# Patient Record
Sex: Female | Born: 1995 | Hispanic: Yes | Marital: Single | Smoking: Former smoker
Health system: Southern US, Community
[De-identification: ages and names within clinical notes are randomized; demographics above are authoritative.]

## PROBLEM LIST (undated history)

## (undated) ENCOUNTER — Inpatient Hospital Stay (HOSPITAL_COMMUNITY): Payer: Self-pay

## (undated) DIAGNOSIS — Z8632 Personal history of gestational diabetes: Secondary | ICD-10-CM

## (undated) DIAGNOSIS — Z789 Other specified health status: Secondary | ICD-10-CM

---
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
## 2016-07-22 LAB — OB RESULTS CONSOLE ABO/RH: RH Type: POSITIVE

## 2016-07-22 LAB — OB RESULTS CONSOLE GC/CHLAMYDIA
Chlamydia: NEGATIVE
Gonorrhea: NEGATIVE

## 2016-07-22 LAB — OB RESULTS CONSOLE ANTIBODY SCREEN: ANTIBODY SCREEN: NEGATIVE

## 2016-07-22 LAB — OB RESULTS CONSOLE PLATELET COUNT: Platelets: 287 10*3/uL

## 2016-07-22 LAB — OB RESULTS CONSOLE HGB/HCT, BLOOD
HCT: 40 %
Hemoglobin: 13.2 g/dL

## 2016-07-22 LAB — OB RESULTS CONSOLE RPR: RPR: NONREACTIVE

## 2016-07-22 LAB — OB RESULTS CONSOLE VARICELLA ZOSTER ANTIBODY, IGG: VARICELLA IGG: IMMUNE

## 2016-08-01 LAB — CULTURE, OB URINE: Urine Culture, OB: NO GROWTH

## 2016-08-01 LAB — HEMOGLOBIN EVAL RFX ELECTROPHORESIS: HEMOGLOBIN EVALUATION: NORMAL

## 2016-08-01 LAB — SICKLE CELL SCREEN: Sickle Cell Screen: NORMAL

## 2016-08-01 LAB — OB RESULTS CONSOLE HIV ANTIBODY (ROUTINE TESTING): HIV: NONREACTIVE

## 2016-08-01 LAB — OB RESULTS CONSOLE HEPATITIS B SURFACE ANTIGEN: Hepatitis B Surface Ag: NEGATIVE

## 2016-08-01 LAB — CYTOLOGY - PAP: PAP SMEAR: NEGATIVE

## 2016-08-01 LAB — OB RESULTS CONSOLE RUBELLA ANTIBODY, IGM: Rubella: IMMUNE

## 2016-08-02 ENCOUNTER — Other Ambulatory Visit (HOSPITAL_COMMUNITY): Payer: Self-pay | Admitting: Nurse Practitioner

## 2016-08-02 DIAGNOSIS — Z3682 Encounter for antenatal screening for nuchal translucency: Secondary | ICD-10-CM

## 2016-08-09 ENCOUNTER — Encounter: Payer: Self-pay | Admitting: *Deleted

## 2016-08-12 ENCOUNTER — Ambulatory Visit: Payer: Medicaid Other | Admitting: *Deleted

## 2016-08-12 ENCOUNTER — Encounter: Payer: Medicaid Other | Attending: Family Medicine | Admitting: *Deleted

## 2016-08-12 DIAGNOSIS — Z713 Dietary counseling and surveillance: Secondary | ICD-10-CM | POA: Insufficient documentation

## 2016-08-12 DIAGNOSIS — O24410 Gestational diabetes mellitus in pregnancy, diet controlled: Secondary | ICD-10-CM | POA: Diagnosis not present

## 2016-08-12 DIAGNOSIS — Z3A00 Weeks of gestation of pregnancy not specified: Secondary | ICD-10-CM | POA: Diagnosis not present

## 2016-08-12 NOTE — Progress Notes (Signed)
  Patient was seen on 08/12/2016 for Gestational Diabetes self-management . She speaks [place], used live Veterinary surgeon. She states the nutrition was covered at her Health Dept. Visit. The following learning objectives were met by the patient :   States the definition of Gestational Diabetes  States why dietary management is important in controlling blood glucose  States when to check blood glucose levels  Demonstrates proper blood glucose monitoring techniques  States the effect of stress and exercise on blood glucose levels  States the importance of limiting caffeine and abstaining from alcohol and smoking  Plan:  Aim for 3 Carb Choices per meal (45 grams) +/- 1 either way  Aim for 1-2 Carbs per snack Begin reading food labels for Total Carbohydrate of foods Consider  increasing your activity level by walking or other activity daily as tolerated Begin checking BG before breakfast and 2 hours after first bite of breakfast, lunch and dinner as directed by MD  Bring Log Book to every medical appointment   Take medication if directed by MD  Blood glucose monitor Rx called into pharmacy> Accu Chek Aviva with Fast Clix drums Patient instructed to test pre breakfast and 2 hours each meal as directed by MD  Patient instructed to monitor glucose levels: FBS: 60 - <90 2 hour: <120  Patient received the following handouts: in Spanish  Nutrition Diabetes and Pregnancy  Carbohydrate Counting List  Patient will be seen for follow-up as needed.

## 2016-08-14 ENCOUNTER — Other Ambulatory Visit: Payer: Self-pay | Admitting: *Deleted

## 2016-08-14 DIAGNOSIS — O24419 Gestational diabetes mellitus in pregnancy, unspecified control: Secondary | ICD-10-CM

## 2016-08-14 MED ORDER — ACCU-CHEK GUIDE W/DEVICE KIT: 1.0000 | PACK | Freq: Once | 0 refills | Status: DC

## 2016-08-14 MED ORDER — ACCU-CHEK GUIDE W/DEVICE KIT: 1.0000 | PACK | Freq: Once | 0 refills | Status: AC

## 2016-08-14 MED ORDER — ACCU-CHEK FASTCLIX LANCETS MISC: 1.0000 | Freq: Four times a day (QID) | 12 refills | Status: DC

## 2016-08-14 MED ORDER — GLUCOSE BLOOD VI STRP: ORAL_STRIP | 12 refills | Status: DC

## 2016-08-15 ENCOUNTER — Encounter (HOSPITAL_COMMUNITY): Payer: Self-pay | Admitting: *Deleted

## 2016-08-16 ENCOUNTER — Ambulatory Visit (HOSPITAL_COMMUNITY)
Admission: RE | Admit: 2016-08-16 | Discharge: 2016-08-16 | Disposition: A | Discharge status: Home / Self Care | Payer: Medicaid Other | Source: Ambulatory Visit | Attending: Nurse Practitioner | Admitting: Nurse Practitioner

## 2016-08-16 ENCOUNTER — Encounter (HOSPITAL_COMMUNITY): Payer: Self-pay

## 2016-08-16 DIAGNOSIS — Z3682 Encounter for antenatal screening for nuchal translucency: Secondary | ICD-10-CM | POA: Diagnosis present

## 2016-08-16 HISTORY — DX: Other specified health status: Z78.9

## 2016-08-20 ENCOUNTER — Other Ambulatory Visit (HOSPITAL_COMMUNITY): Payer: Self-pay | Admitting: *Deleted

## 2016-08-20 DIAGNOSIS — O24319 Unspecified pre-existing diabetes mellitus in pregnancy, unspecified trimester: Secondary | ICD-10-CM

## 2016-08-21 ENCOUNTER — Encounter: Payer: Self-pay | Admitting: *Deleted

## 2016-08-21 ENCOUNTER — Ambulatory Visit (INDEPENDENT_AMBULATORY_CARE_PROVIDER_SITE_OTHER): Payer: Medicaid Other | Admitting: Family Medicine

## 2016-08-21 ENCOUNTER — Encounter: Payer: Self-pay | Admitting: Family Medicine

## 2016-08-21 DIAGNOSIS — O0990 Supervision of high risk pregnancy, unspecified, unspecified trimester: Secondary | ICD-10-CM | POA: Insufficient documentation

## 2016-08-21 DIAGNOSIS — Z8632 Personal history of gestational diabetes: Secondary | ICD-10-CM

## 2016-08-21 DIAGNOSIS — O0992 Supervision of high risk pregnancy, unspecified, second trimester: Secondary | ICD-10-CM

## 2016-08-21 DIAGNOSIS — O24419 Gestational diabetes mellitus in pregnancy, unspecified control: Secondary | ICD-10-CM | POA: Diagnosis not present

## 2016-08-21 HISTORY — DX: Personal history of gestational diabetes: Z86.32

## 2016-08-21 MED ORDER — ACCU-CHEK FASTCLIX LANCETS MISC: 1.0000 | Freq: Four times a day (QID) | 12 refills | Status: DC

## 2016-08-21 MED ORDER — ACCU-CHEK GUIDE W/DEVICE KIT: 1.0000 | PACK | Freq: Once | 0 refills | Status: AC

## 2016-08-21 MED ORDER — GLUCOSE BLOOD VI STRP: ORAL_STRIP | 12 refills | Status: DC

## 2016-08-21 NOTE — Patient Instructions (Signed)
Diagnstico de diabetes mellitus gestacional (Gestational Diabetes Mellitus, Diagnosis) La diabetes gestacional (diabetes mellitus gestacional) es una forma temporal de diabetes que algunas mujeres desarrollan durante el embarazo. Generalmente, ocurre alrededor de la semana 24 a la 28de gestacin y desaparece despus del parto. Los cambios hormonales durante el embarazo pueden interferir en la produccin y la actividad de la insulina, lo que puede derivar en uno de estos problemas o en ambos:  El pncreas no produce la cantidad suficiente de una hormona llamada insulina.  Las clulas del cuerpo no responden de manera adecuada a la insulina que el organismo produce (resistencia a la insulina). Normalmente, la insulina estimula el ingreso de la glucosa en las clulas del cuerpo. Las clulas usan la glucosa para obtener energa. La resistencia a la insulina o la falta de esta hormona hace que el exceso de glucosa se acumule en la sangre, en lugar de ir a las clulas. Como resultado, aumenta la glucemia (hiperglucemia). CULES SON LOS RIESGOS? Si la diabetes gestacional se trata, hay pocas probabilidades de que ocasione problemas. Si no se la controla con tratamiento, puede causar problemas durante el trabajo de parto y el parto, y algunos de esos problemas pueden ser dainos para el feto y la madre. La diabetes gestacional que no se controla tambin puede producir problemas respiratorios y bajo nivel de glucemia en el recin nacido. Las mujeres que tienen diabetes gestacional son ms propensas a desarrollar la afeccin si se embarazan de nuevo y a tener diabetes tipo2 en el futuro. QU INCREMENTA EL RIESGO? Es ms probable que esta afeccin se manifieste en las mujeres embarazadas que:  Son mayores de 26aos durante el embarazo.  Tienen antecedentes familiares de diabetes.  Tienen sobrepeso.  Tuvieron diabetes gestacional en el pasado.  Tienen sndrome de ovario poliqustico (SOP).  Tienen  un embarazo gemelar o un embarazo mltiple.  Son descendientes de indgenas norteamericanos, afroamericanos, hispanos o latinos, o asiticos o isleos del Pacfico. CULES SON LOS SIGNOS O LOS SNTOMAS? La mayora de las mujeres no perciben los sntomas de la diabetes gestacional porque son similares a los sntomas normales del embarazo. Los sntomas de la diabetes gestacional pueden incluir, entre otros:  Aumento de la sed (polidipsia).  Aumento del apetito (polifagia).  Aumento de la miccin (poliuria). CMO SE DIAGNOSTICA? Esta afeccin se puede diagnosticar en funcin del nivel de glucemia que puede medirse con uno o ms de los siguientes anlisis de sangre:  Medicin de la glucemia en ayunas. No se le permitir comer (tendr que hacer ayuno) durante al menos 8horas antes de que se tome una muestra de sangre.  Prueba al azar de la glucemia. Esta prueba mide la glucemia en cualquier momento del da, sin importar cundo comi.  Prueba de tolerancia a la glucosa oral (PTGO). Generalmente, se realiza durante la semana 24 a la 28de gestacin. ? Para esta prueba, le harn una medicin de la glucemia en ayunas. Luego, tomar una bebida que contiene glucosa. Se analizar el nivel de glucemia nuevamente 1hora despus de tomar la bebida con glucosa (PTGO a la hora). ? Si el resultado de la PTGO a la hora es igual o superior a 140mg/dl (7,8mmol/l), le repetirn la PTGO. Esta vez, se analizar el nivel de glucemia 3horas despus de tomar la bebida con glucosa (PTGO a las 3horas). Si tiene factores de riesgo, pueden hacerle pruebas de deteccin de la diabetes tipo2 no diagnosticada en la primera visita de atencin mdica durante el embarazo (visita prenatal). CMO SE TRATA ESTA AFECCIN?   El tratamiento puede estar a cargo de un especialista llamado endocrinlogo. Para tratar esta afeccin, debe seguir las indicaciones del mdico respecto de lo siguiente:  Comer una dieta ms saludable y  aumentar la actividad fsica. Estos cambios son lo ms importante para mantener la diabetes gestacional bajo control.  Controlarse la glucemia. Hgalo con la frecuencia que le hayan indicado.  Tomar los medicamentos para la diabetes o aplicarse la insulina todos los das. Estos frmacos se recetarn solamente si son necesarios. ? Si usa insulina, tal vez deba ajustar las dosis en funcin de la cantidad de actividad fsica que realiza y de los alimentos que consume. El mdico le indicar cmo hacerlo. El mdico establecer los objetivos del tratamiento para usted sobre la base de la etapa del embarazo en la que se encuentra y de cualquier otra afeccin que padezca. Generalmente, el objetivo del tratamiento es mantener los siguientes niveles de glucemia durante el embarazo:  En ayunas: igual o menor que 95mg/dl (5,3mmol/l).  Despus de las comidas (posprandial): ? Una hora despus de una comida: igual o menor que 140mg/dl (7,8mmol/l). ? Dos horas despus de una comida: igual o menor que 120mg/dl (6,7mmol/l).  Nivel de A1c (hemoglobinaA1c): del 6% al 6,5%. SIGA ESTAS INDICACIONES EN SU CASA: Preguntas para hacerle al mdico Considere la posibilidad de hacer las siguientes preguntas:  Debo reunirme con un instructor para el cuidado de la diabetes?  Dnde puedo encontrar un grupo de apoyo para personas diabticas?  Qu equipos necesitar para controlar la diabetes en casa?  Qu medicamentos para la diabetes necesito y cundo debo tomarlos?  Con qu frecuencia debo controlarme la glucemia?  A qu nmero puedo llamar si tengo preguntas?  Cundo es mi prxima cita? Instrucciones generales  Tome los medicamentos de venta libre y los recetados solamente como se lo haya indicado el mdico.  Controle el aumento de peso durante el embarazo. La cantidad de peso que se espera que aumente depende de su IMC (ndice de masa corporal) antes del embarazo.  Concurra a todas las  visitas de control como se lo haya indicado el mdico. Esto es importante.  Para obtener ms informacin sobre la diabetes, visite los siguientes sitios: ? Asociacin Americana de la Diabetes (American Diabetes Association, ADA): www.diabetes.org ? Asociacin Norteamericana de Instructores para el Cuidado de la Diabetes (American Association of Diabetes Educators, AADE): www.diabeteseducator.org/patient-resources COMUNQUESE CON UN MDICO SI:  El nivel de glucemia es igual o superior a 240mg/dl (13,3mmol/l).  El nivel de glucemia es igual o superior a 200mg/dl (11,1mmol/l), y tiene cetonas en la orina.  Ha estado enferma o ha tenido fiebre durante 2o ms das y no mejora.  Tiene alguno de los siguientes problemas durante ms de 6horas: ? No puede comer ni beber. ? Tiene nuseas y vmitos. ? Tiene diarrea.  SOLICITE AYUDA DE INMEDIATO SI:  Su nivel de glucosa en la sangre est por debajo de 54mg/dl (3mmol/l).  Est confundida o tiene dificultad para pensar con claridad.  Tiene dificultad para respirar.  Tiene un nivel moderado o alto de cetonas en la orina.  El beb se mueve menos de lo habitual.  Tiene secreciones inusuales o sangrado de la vagina.  Comienza a tener contracciones antes de tiempo (prematuramente). Las contracciones se pueden sentir como un endurecimiento de la parte inferior del abdomen.  Esta informacin no tiene como fin reemplazar el consejo del mdico. Asegrese de hacerle al mdico cualquier pregunta que tenga. Document Released: 01/02/2005 Document Revised: 07/17/2015 Document Reviewed: 04/28/2015 Elsevier Interactive   Education  2017 Elsevier Inc.  

## 2016-08-21 NOTE — Progress Notes (Signed)
  Subjective:  Aimee Lopez is a G2P0010 71w2dbeing seen today for her first obstetrical visit.  [facility] and had abnormal GTT. Her obstetrical history is significant for teen pregnancy and early gestational diabetes. Patient does intend to breast feed. Pregnancy history fully reviewed.  Patient reports no complaints.  BP (!) 112/54   Pulse 72   Wt 173 lb (78.5 kg)   LMP 05/20/2016   BMI 30.65 kg/m   HISTORY: OB History  Gravida Para Term Preterm AB Living  2 0 0 0 1 0  SAB TAB Ectopic Multiple Live Births  1 0 0 0 0    # Outcome Date GA Lbr Len/2nd Weight Sex Delivery Anes PTL Lv  2 Current           1 SAB 2016              Past Medical History:  Diagnosis Date  . Medical history non-contributory     Past Surgical History:  Procedure Laterality Date  . NO PAST SURGERIES      Family History  Problem Relation Age of Onset  . Diabetes Mother   . Diabetes Father   . Diabetes Maternal Aunt      Exam    Uterus:     Pelvic Exam:   System:     Skin: normal coloration and turgor, no rashes    Neurologic: gait normal; reflexes normal and symmetric   Extremities: normal strength, tone, and muscle mass, no erythema, induration, or nodules, no evidence of joint instability   HEENT PERRLA and extra ocular movement intact   Mouth/Teeth mucous membranes moist, pharynx normal without lesions   Neck supple and no masses   Cardiovascular: regular rate and rhythm, no murmurs or gallops   Respiratory:  appears well, vitals normal, no respiratory distress, acyanotic, normal RR, ear and throat exam is normal, neck free of mass or lymphadenopathy, chest clear, no wheezing, crepitations, rhonchi, normal symmetric air entry   Abdomen: soft, non-tender; bowel sounds normal; no masses,  no organomegaly      Assessment:    Pregnancy: G2P0010 Patient Active Problem List   Diagnosis Date Noted  . Gestational diabetes mellitus 08/21/2016  .  Supervision of high risk pregnancy, antepartum 08/21/2016      Plan:   1. Supervision of high risk pregnancy, antepartum FHT normal - Comprehensive metabolic panel - Hemoglobin A1c - Protein / creatinine ratio, urine  2. Gestational diabetes mellitus (GDM) affecting pregnancy, antepartum CBG supplies ordered. Will check HgA1c, CMP, UP:C. Discussed GDM and impact on health of baby, pregnancy, and delivery. Risks of shoulder dystocia, cesarean delivery, Brachial plexus injury, stillbirth reviewed.  - ACCU-CHEK FASTCLIX LANCETS MISC; 1 Device by Percutaneous route 4 (four) times daily.  Dispense: 100 each; Refill: 12 - glucose blood (ACCU-CHEK GUIDE) test strip; Use as instructed  Dispense: 100 each; Refill: 12 - Blood Glucose Monitoring Suppl (ACCU-CHEK GUIDE) w/Device KIT; 1 Device by Does not apply route once.  Dispense: 1 kit; Refill: 0 - Comprehensive metabolic panel - Hemoglobin A1c - Protein / creatinine ratio, urine     Problem list reviewed and updated. 100% of 30 min visit spent on counseling and coordination of care.     JTruett Mainland4/13/2018

## 2016-08-22 LAB — COMPREHENSIVE METABOLIC PANEL
A/G RATIO: 1.3 (ref 1.2–2.2)
ALT: 36 IU/L — ABNORMAL HIGH (ref 0–32)
AST: 24 IU/L (ref 0–40)
Albumin: 3.7 g/dL (ref 3.5–5.5)
Alkaline Phosphatase: 83 IU/L (ref 39–117)
BUN/Creatinine Ratio: 14 (ref 9–23)
BUN: 7 mg/dL (ref 6–20)
Bilirubin Total: <0.2 mg/dL (ref 0.0–1.2)
CO2: 20 mmol/L (ref 18–29)
Calcium: 8.6 mg/dL — ABNORMAL LOW (ref 8.7–10.2)
Chloride: 105 mmol/L (ref 96–106)
Creatinine, Ser: 0.49 mg/dL — ABNORMAL LOW (ref 0.57–1.00)
GFR calc Af Amer: 163 mL/min/{1.73_m2} (ref >59)
GFR calc non Af Amer: 142 mL/min/{1.73_m2} (ref >59)
GLUCOSE: 92 mg/dL (ref 65–99)
Globulin, Total: 2.8 g/dL (ref 1.5–4.5)
Potassium: 3.9 mmol/L (ref 3.5–5.2)
Sodium: 138 mmol/L (ref 134–144)
Total Protein: 6.5 g/dL (ref 6.0–8.5)

## 2016-08-22 LAB — HEMOGLOBIN A1C
Est. average glucose Bld gHb Est-mCnc: 105 mg/dL
Hgb A1c MFr Bld: 5.3 % (ref 4.8–5.6)

## 2016-09-09 ENCOUNTER — Ambulatory Visit (INDEPENDENT_AMBULATORY_CARE_PROVIDER_SITE_OTHER): Payer: Medicaid Other | Admitting: Obstetrics & Gynecology

## 2016-09-09 ENCOUNTER — Other Ambulatory Visit (HOSPITAL_COMMUNITY)
Admission: RE | Admit: 2016-09-09 | Discharge: 2016-09-09 | Disposition: A | Discharge status: Home / Self Care | Payer: Medicaid Other | Source: Ambulatory Visit | Attending: Obstetrics & Gynecology | Admitting: Obstetrics & Gynecology

## 2016-09-09 VITALS — BP 116/58 | HR 68 | Wt 171.2 lb

## 2016-09-09 DIAGNOSIS — O0992 Supervision of high risk pregnancy, unspecified, second trimester: Secondary | ICD-10-CM

## 2016-09-09 DIAGNOSIS — O0990 Supervision of high risk pregnancy, unspecified, unspecified trimester: Secondary | ICD-10-CM

## 2016-09-09 DIAGNOSIS — O24410 Gestational diabetes mellitus in pregnancy, diet controlled: Secondary | ICD-10-CM | POA: Insufficient documentation

## 2016-09-09 NOTE — Progress Notes (Signed)
   PRENATAL VISIT NOTE  Subjective:  Aimee Lopez is a 21 y.o. G2P0010 at 6260w0d being seen today for ongoing prenatal care.  She is currently monitored for the following issues for this high-risk pregnancy and has Gestational diabetes mellitus and Supervision of high risk pregnancy, antepartum on her problem list.  Patient reports rare pulling on right side.  No pressure nor discharge. Denies leaking of fluid.   The following portions of the patient's history were reviewed and updated as appropriate: allergies, current medications, past family history, past medical history, past social history, past surgical history and problem list. Problem list updated.  Objective:  There were no vitals filed for this visit.  Fetal Status:         140  General:  Alert, oriented and cooperative. Patient is in no acute distress.  Skin: Skin is warm and dry. No rash noted.   Cardiovascular: Normal heart rate noted  Respiratory: Normal respiratory effort, no problems with respiration noted  Abdomen: Soft, gravid, appropriate for gestational age.       Pelvic:  Cervical exam deferred        Extremities: Normal range of motion.     Mental Status: Normal mood and affect. Normal behavior. Normal judgment and thought content.   Assessment and Plan:  Pregnancy: G2P0010 at 9760w0d  1. Diet controlled gestational diabetes mellitus (GDM) in second trimester Fastings nml but one (high nml); encouraged to eat bedtime snack and evening walk.   Pp values are normal Nml Hgb A1c during organogenesis--no echo needed  2. Supervision of high risk pregnancy, antepartum -rpt protein creat ratio ALT mildly elevated in May 2018; Rpt in June or early July  Preterm labor symptoms and general obstetric precautions including but not limited to vaginal bleeding, contractions, leaking of fluid and fetal movement were reviewed in detail with the patient. Please refer to After Visit Summary for other counseling recommendations.     RTC 3 weeks  Elsie LincolnKelly Dereona Kolodny, MD

## 2016-09-10 LAB — CERVICOVAGINAL ANCILLARY ONLY
Bacterial vaginitis: NEGATIVE
CANDIDA VAGINITIS: NEGATIVE
Chlamydia: NEGATIVE
Neisseria Gonorrhea: NEGATIVE
Trichomonas: NEGATIVE

## 2016-09-27 ENCOUNTER — Encounter (HOSPITAL_COMMUNITY): Payer: Self-pay

## 2016-09-27 ENCOUNTER — Ambulatory Visit (HOSPITAL_COMMUNITY)
Admission: RE | Admit: 2016-09-27 | Discharge: 2016-09-27 | Disposition: A | Discharge status: Home / Self Care | Payer: Medicaid Other | Source: Ambulatory Visit | Attending: Nurse Practitioner | Admitting: Nurse Practitioner

## 2016-09-27 ENCOUNTER — Other Ambulatory Visit (HOSPITAL_COMMUNITY): Payer: Self-pay | Admitting: Maternal and Fetal Medicine

## 2016-09-27 DIAGNOSIS — O24319 Unspecified pre-existing diabetes mellitus in pregnancy, unspecified trimester: Secondary | ICD-10-CM

## 2016-09-27 DIAGNOSIS — O0990 Supervision of high risk pregnancy, unspecified, unspecified trimester: Secondary | ICD-10-CM

## 2016-09-27 DIAGNOSIS — O99212 Obesity complicating pregnancy, second trimester: Secondary | ICD-10-CM

## 2016-09-27 DIAGNOSIS — Z3689 Encounter for other specified antenatal screening: Secondary | ICD-10-CM

## 2016-09-27 DIAGNOSIS — O24410 Gestational diabetes mellitus in pregnancy, diet controlled: Secondary | ICD-10-CM

## 2016-09-27 DIAGNOSIS — Z3A18 18 weeks gestation of pregnancy: Secondary | ICD-10-CM | POA: Insufficient documentation

## 2016-09-30 ENCOUNTER — Other Ambulatory Visit (HOSPITAL_COMMUNITY): Payer: Self-pay | Admitting: *Deleted

## 2016-09-30 DIAGNOSIS — Z0489 Encounter for examination and observation for other specified reasons: Secondary | ICD-10-CM

## 2016-09-30 DIAGNOSIS — IMO0002 Reserved for concepts with insufficient information to code with codable children: Secondary | ICD-10-CM

## 2016-10-02 ENCOUNTER — Ambulatory Visit (INDEPENDENT_AMBULATORY_CARE_PROVIDER_SITE_OTHER): Payer: Medicaid Other | Admitting: Obstetrics & Gynecology

## 2016-10-02 VITALS — BP 110/55 | HR 82 | Wt 174.9 lb

## 2016-10-02 DIAGNOSIS — O0992 Supervision of high risk pregnancy, unspecified, second trimester: Secondary | ICD-10-CM | POA: Diagnosis not present

## 2016-10-02 DIAGNOSIS — O24410 Gestational diabetes mellitus in pregnancy, diet controlled: Secondary | ICD-10-CM | POA: Diagnosis not present

## 2016-10-02 DIAGNOSIS — O0990 Supervision of high risk pregnancy, unspecified, unspecified trimester: Secondary | ICD-10-CM

## 2016-10-02 LAB — POCT URINALYSIS DIP (DEVICE)
Bilirubin Urine: NEGATIVE
Glucose, UA: NEGATIVE mg/dL
Hgb urine dipstick: NEGATIVE
KETONES UR: NEGATIVE mg/dL
Nitrite: NEGATIVE
Protein, ur: NEGATIVE mg/dL
Specific Gravity, Urine: 1.015 (ref 1.005–1.030)
Urobilinogen, UA: 1 mg/dL (ref 0.0–1.0)
pH: 7.5 (ref 5.0–8.0)

## 2016-10-02 MED ORDER — PREPLUS 27-1 MG PO TABS: 1.0000 | ORAL_TABLET | Freq: Every day | ORAL | 13 refills | Status: DC

## 2016-10-02 NOTE — Patient Instructions (Signed)
Regrese a la clinica cuando tenga su cita. Si tiene problemas o preguntas, llama a la clinica o vaya a la sala de emergencia al [facility].  Segundo trimestre de Psychiatristembarazo (Second Trimester of Pregnancy) El segundo trimestre va desde la semana13 hasta la 28, desde el cuarto hasta el sexto mes, y suele ser el momento en el que mejor se siente. Su organismo se ha adaptado a Charity fundraiserestar embarazada y comienza a Diplomatic Services operational officersentirse fsicamente mejor. En general, las nuseas matutinas han disminuido o han desaparecido completamente, puede tener ms energa y un aumento de apetito. El segundo trimestre es tambin la poca en la que el feto se desarrolla rpidamente. Hacia el final del sexto mes, el feto mide aproximadamente 9pulgadas (23cm) y pesa alrededor de 1 libras (700g). Es probable que sienta que el beb se Teacher, English as a foreign languagemueve (da pataditas) entre las 18 y 20semanas del Psychiatristembarazo. CAMBIOS EN EL ORGANISMO Su organismo atraviesa por muchos cambios durante el Burnsembarazo, y estos varan de Neomia Dearuna mujer a Educational psychologistotra.  Seguir American Standard Companiesaumentando de peso. Notar que la parte baja del abdomen sobresale.  Podrn aparecer las primeras Albertson'sestras en las caderas, el abdomen y las Speedwaymamas.  Es posible que tenga dolores de cabeza que pueden aliviarse con los medicamentos que el mdico le permita tomar.  Tal vez tenga necesidad de orinar con ms frecuencia porque el feto est ejerciendo presin Ambulance personsobre la vejiga.  Debido al Vanetta Muldersembarazo podr sentir Anthoney Haradaacidez estomacal con frecuencia.  Puede estar estreida, ya que ciertas hormonas enlentecen los movimientos de los msculos que New York Life Insuranceempujan los desechos a travs de los intestinos.  Pueden aparecer hemorroides o abultarse e hincharse las venas (venas varicosas).  Puede tener dolor de espalda que se debe al Citigroupaumento de peso y a que las hormonas del Management consultantembarazo relajan las articulaciones entre los huesos de la pelvis, y Public librariancomo consecuencia de la modificacin del peso y los msculos que mantienen el equilibrio.  Las ConAgra Foodsmamas  seguirn creciendo y Development worker, communityle dolern.  Las Veterinary surgeonencas pueden sangrar y estar sensibles al cepillado y al hilo dental.  Pueden aparecer zonas oscuras o manchas (cloasma, mscara del Psychiatristembarazo) en el rostro que probablemente se atenuar despus del nacimiento del beb.  Es posible que se forme una lnea oscura desde el ombligo hasta la zona del pubis (linea nigra) que probablemente se atenuar despus del nacimiento del beb.  Tal vez haya cambios en el cabello que pueden incluir su engrosamiento, crecimiento rpido y cambios en la textura. Adems, a algunas mujeres se les cae el cabello durante o despus del embarazo, o tienen el cabello seco o fino. Lo ms probable es que el cabello se le normalice despus del nacimiento del beb. QU DEBE ESPERAR EN LAS CONSULTAS PRENATALES Durante una visita prenatal de rutina:  La pesarn para asegurarse de que usted y el feto estn creciendo normalmente.  Le tomarn la presin arterial.  Le medirn el abdomen para controlar el desarrollo del beb.  Se escucharn los latidos cardacos fetales.  Se evaluarn los resultados de los estudios solicitados en visitas anteriores. El mdico puede preguntarle lo siguiente:  Cmo se siente.  Si siente los movimientos del beb.  Si ha tenido sntomas anormales, como prdida de lquido, Kingston Springssangrado, dolores de cabeza intensos o clicos abdominales.  Si est consumiendo algn producto que contenga tabaco, como cigarrillos, tabaco de Theatre managermascar y Administrator, Civil Servicecigarrillos electrnicos.  Si tiene Colgate-Palmolivealguna pregunta. Otros estudios que podrn realizarse durante el segundo trimestre incluyen lo siguiente:  Anlisis de sangre para detectar lo siguiente: ? Concentraciones de  hierro bajas (anemia). ? Diabetes gestacional (entre la semana 24 y la 28). ? Anticuerpos Rh.  Anlisis de orina para detectar infecciones, diabetes o protenas en la orina.  Una ecografa para confirmar que el beb crece y se desarrolla correctamente.  Una  amniocentesis para diagnosticar posibles problemas genticos.  Estudios del feto para descartar espina bfida y sndrome de Down.  Prueba del VIH (virus de inmunodeficiencia humana). Los exmenes prenatales de rutina incluyen la prueba de deteccin del VIH, a menos que decida no Futures traderrealizrsela. INSTRUCCIONES PARA EL CUIDADO EN EL HOGAR  Evite fumar, consumir hierbas, beber alcohol y tomar frmacos que no le hayan recetado. Estas sustancias qumicas afectan la formacin y el desarrollo del beb.  No consuma ningn producto que contenga tabaco, lo que incluye cigarrillos, tabaco de Theatre managermascar y Administrator, Civil Servicecigarrillos electrnicos. Si necesita ayuda para dejar de fumar, consulte al American Expressmdico. Puede recibir asesoramiento y otro tipo de recursos para dejar de fumar.  Siga las indicaciones del mdico en relacin con el uso de medicamentos. Durante el embarazo, hay medicamentos que son seguros de tomar y otros que no.  Haga ejercicio solamente como se lo haya indicado el mdico. Sentir clicos uterinos es un buen signo para Restaurant manager, fast fooddetener la actividad fsica.  Contine comiendo alimentos sanos con regularidad.  Use un sostn que le brinde buen soporte si le Altria Groupduelen las mamas.  No se d baos de inmersin en agua caliente, baos turcos ni saunas.  Use el cinturn de seguridad en todo momento mientras conduce.  No coma carne cruda ni queso sin cocinar; evite el contacto con las bandejas sanitarias de los gatos y la tierra que estos animales usan. Estos elementos contienen grmenes que pueden causar defectos congnitos en el beb.  Tome las vitaminas prenatales.  Tome entre 1500 y 2000mg  de calcio diariamente comenzando en la semana20 del embarazo [place] Lakehasta el parto.  Si est estreida, pruebe un laxante suave (si el mdico lo autoriza). Consuma ms alimentos ricos en fibra, como vegetales y frutas frescos y Radiation protection practitionercereales integrales. Beba gran cantidad de lquido para mantener la orina de tono claro o color amarillo plido.  Dese  baos de asiento con agua tibia para Engineer, materialsaliviar el dolor o las molestias causadas por las hemorroides. Use una crema para las hemorroides si el mdico la autoriza.  Si tiene venas varicosas, use medias de descanso. Eleve los pies durante 15minutos, 3 o 4veces por da. Limite el consumo de sal en su dieta.  No levante objetos pesados, use zapatos de tacones bajos y [address] Boulevardmantenga una buena postura.  Descanse con las piernas elevadas si tiene calambres o dolor de cintura.  Visite a su dentista si an no lo ha Occupational hygienisthecho durante el embarazo. Use un cepillo de dientes blando para higienizarse los dientes y psese el hilo dental con suavidad.  Puede seguir Calpine Corporationmanteniendo relaciones sexuales, a menos que el mdico le indique lo contrario.  Concurra a todas las visitas prenatales segn las indicaciones de su mdico.  SOLICITE ATENCIN MDICA SI:  Santa Generaiene mareos.  Siente clicos leves, presin en la pelvis o dolor persistente en el abdomen.  Tiene nuseas, vmitos o diarrea persistentes.  Brett Fairybserva una secrecin vaginal con mal olor.  Siente dolor al ConocoPhillipsorinar.  SOLICITE ATENCIN MDICA DE INMEDIATO SI:  Tiene fiebre.  Tiene una prdida de lquido por la vagina.  Tiene sangrado o pequeas prdidas vaginales.  Siente dolor intenso o clicos en el abdomen.  Sube o baja de peso rpidamente.  Tiene dificultad para respirar y Electronics engineersiente dolor de  pecho.  Sbitamente se le hinchan mucho el rostro, las manos, los tobillos, los pies o las piernas.  No ha sentido los movimientos del beb durante Georgianne Fickuna hora.  Siente un dolor de cabeza intenso que no se alivia con medicamentos.  Su visin se modifica.  Esta informacin no tiene Theme park managercomo fin reemplazar el consejo del mdico. Asegrese de hacerle al mdico cualquier pregunta que tenga. Document Released: 01/02/2005 Document Revised: 04/15/2014 Document Reviewed: 05/26/2012 Elsevier Interactive Patient Education  2017 ArvinMeritorElsevier Inc.

## 2016-10-02 NOTE — Progress Notes (Signed)
   PRENATAL VISIT NOTE  Subjective:  Aimee Lopez is a 21 y.o. G2P0010 at 7362w2d being seen today for ongoing prenatal care. Patient is Spanish-speaking only, Spanish interpreter present for this encounter.  She is currently monitored for the following issues for this high-risk pregnancy and has Gestational diabetes mellitus and Supervision of high risk pregnancy, antepartum on her problem list.  Patient reports no complaints.  Contractions: Not present. Vag. Bleeding: None.  Movement: Present. Denies leaking of fluid.   The following portions of the patient's history were reviewed and updated as appropriate: allergies, current medications, past family history, past medical history, past social history, past surgical history and problem list. Problem list updated.  Objective:   Vitals:   10/02/16 1546  BP: (!) 110/55  Pulse: 82  Weight: 174 lb 14.4 oz (79.3 kg)    Fetal Status: Fetal Heart Rate (bpm): 145 Fundal Height: 19 cm Movement: Present     General:  Alert, oriented and cooperative. Patient is in no acute distress.  Skin: Skin is warm and dry. No rash noted.   Cardiovascular: Normal heart rate noted  Respiratory: Normal respiratory effort, no problems with respiration noted  Abdomen: Soft, gravid, appropriate for gestational age. Pain/Pressure: Absent     Pelvic:  Cervical exam deferred        Extremities: Normal range of motion.  Edema: Trace  Mental Status: Normal mood and affect. Normal behavior. Normal judgment and thought content.   Assessment and Plan:  Pregnancy: G2P0010 at 5562w2d  1. Diet controlled gestational diabetes mellitus (GDM) in second trimester Blood sugars are within range, continue diet and exercise  2. Supervision of high risk pregnancy, antepartum - Prenatal Vit-Fe Fumarate-FA (PREPLUS) 27-1 MG TABS; Take 1 tablet by mouth daily.  Dispense: 30 tablet; Refill: 13  Preterm labor symptoms and general obstetric precautions including but not limited to  vaginal bleeding, contractions, leaking of fluid and fetal movement were reviewed in detail with the patient. Please refer to After Visit Summary for other counseling recommendations.  Return in about 4 weeks (around 10/30/2016) for OB Visit (HOB).   Jaynie CollinsUgonna Deontaye Civello, MD

## 2016-10-02 NOTE — Progress Notes (Signed)
Stratus interpreter SardisMiguel [phone].

## 2016-10-25 ENCOUNTER — Ambulatory Visit (HOSPITAL_COMMUNITY)
Admission: RE | Admit: 2016-10-25 | Discharge: 2016-10-25 | Disposition: A | Discharge status: Home / Self Care | Payer: Medicaid Other | Source: Ambulatory Visit | Attending: Nurse Practitioner | Admitting: Nurse Practitioner

## 2016-10-25 ENCOUNTER — Other Ambulatory Visit (HOSPITAL_COMMUNITY): Payer: Self-pay | Admitting: Maternal and Fetal Medicine

## 2016-10-25 DIAGNOSIS — O24312 Unspecified pre-existing diabetes mellitus in pregnancy, second trimester: Secondary | ICD-10-CM | POA: Insufficient documentation

## 2016-10-25 DIAGNOSIS — Z3682 Encounter for antenatal screening for nuchal translucency: Secondary | ICD-10-CM | POA: Insufficient documentation

## 2016-10-25 DIAGNOSIS — Z362 Encounter for other antenatal screening follow-up: Secondary | ICD-10-CM | POA: Insufficient documentation

## 2016-10-25 DIAGNOSIS — Z0489 Encounter for examination and observation for other specified reasons: Secondary | ICD-10-CM

## 2016-10-25 DIAGNOSIS — Z3A22 22 weeks gestation of pregnancy: Secondary | ICD-10-CM | POA: Insufficient documentation

## 2016-10-25 DIAGNOSIS — O99212 Obesity complicating pregnancy, second trimester: Secondary | ICD-10-CM | POA: Diagnosis not present

## 2016-10-25 DIAGNOSIS — IMO0002 Reserved for concepts with insufficient information to code with codable children: Secondary | ICD-10-CM

## 2016-10-31 ENCOUNTER — Encounter: Payer: Medicaid Other | Admitting: Family Medicine

## 2016-11-28 ENCOUNTER — Ambulatory Visit (INDEPENDENT_AMBULATORY_CARE_PROVIDER_SITE_OTHER): Payer: Self-pay | Admitting: Obstetrics & Gynecology

## 2016-11-28 VITALS — BP 100/72 | HR 82 | Wt 185.0 lb

## 2016-11-28 DIAGNOSIS — O0990 Supervision of high risk pregnancy, unspecified, unspecified trimester: Secondary | ICD-10-CM

## 2016-11-28 DIAGNOSIS — O0992 Supervision of high risk pregnancy, unspecified, second trimester: Secondary | ICD-10-CM

## 2016-11-28 DIAGNOSIS — Z23 Encounter for immunization: Secondary | ICD-10-CM

## 2016-11-28 LAB — POCT URINALYSIS DIP (DEVICE)
Bilirubin Urine: NEGATIVE
Glucose, UA: NEGATIVE mg/dL
HGB URINE DIPSTICK: NEGATIVE
KETONES UR: NEGATIVE mg/dL
Nitrite: NEGATIVE
PH: 7 (ref 5.0–8.0)
PROTEIN: NEGATIVE mg/dL
SPECIFIC GRAVITY, URINE: 1.02 (ref 1.005–1.030)
Urobilinogen, UA: 0.2 mg/dL (ref 0.0–1.0)

## 2016-11-28 LAB — GLUCOSE, CAPILLARY: GLUCOSE-CAPILLARY: 93 mg/dL (ref 65–99)

## 2016-11-28 MED ORDER — PREPLUS 27-1 MG PO TABS: 1.0000 | ORAL_TABLET | Freq: Every day | ORAL | 13 refills | Status: DC

## 2016-11-28 NOTE — Addendum Note (Signed)
Addended by: Cheree Ditto, DEMETRICE A on: 11/28/2016 11:40 AM   Modules accepted: Orders

## 2016-11-28 NOTE — Progress Notes (Signed)
tdap given    PRENATAL VISIT NOTE  Subjective:  Aimee Lopez is a 21 y.o. G2P0010 at 74w3dbeing seen today for ongoing prenatal care.  She is currently monitored for the following issues for this high-risk pregnancy and has Gestational diabetes mellitus and Supervision of high risk pregnancy, antepartum on her problem list.  Patient reports no complaints.   .  .  Movement: Present. Denies leaking of fluid.   The following portions of the patient's history were reviewed and updated as appropriate: allergies, current medications, past family history, past medical history, past social history, past surgical history and problem list. Problem list updated.  Objective:   Vitals:   11/28/16 0833  BP: 100/72  Pulse: 82  Weight: 185 lb (83.9 kg)    Fetal Status: Fetal Heart Rate (bpm): 158   Movement: Present     General:  Alert, oriented and cooperative. Patient is in no acute distress.  Skin: Skin is warm and dry. No rash noted.   Cardiovascular: Normal heart rate noted  Respiratory: Normal respiratory effort, no problems with respiration noted  Abdomen: Soft, gravid, appropriate for gestational age.  Pain/Pressure: Present     Pelvic: Cervical exam deferred        Extremities: Normal range of motion.     Mental Status:  Normal mood and affect. Normal behavior. Normal judgment and thought content.   Assessment and Plan:  Pregnancy: G2P0010 at 244w3d1. Supervision of high risk pregnancy in second trimester - HIV antibody - RPR - CBC - Comp Met (CMET) - PNV printed so she can take to the health dept.  2.  GDM Did not bring log book.  Reports all values to be normal.  Pt encouraged to bring book and meter every visit! Fasting 93.  Preterm labor symptoms and general obstetric precautions including but not limited to vaginal bleeding, contractions, leaking of fluid and fetal movement were reviewed in detail with the patient. Please refer to After Visit Summary for other  counseling recommendations.  Return in about 2 years (around 11/29/2018).   KeSilas SacramentoMD

## 2016-11-30 LAB — URINE CULTURE, OB REFLEX

## 2016-11-30 LAB — CULTURE, OB URINE

## 2016-12-12 ENCOUNTER — Ambulatory Visit (INDEPENDENT_AMBULATORY_CARE_PROVIDER_SITE_OTHER): Payer: Self-pay | Admitting: Obstetrics and Gynecology

## 2016-12-12 VITALS — BP 110/62 | HR 86 | Wt 168.0 lb

## 2016-12-12 DIAGNOSIS — O0993 Supervision of high risk pregnancy, unspecified, third trimester: Secondary | ICD-10-CM

## 2016-12-12 LAB — POCT URINALYSIS DIP (DEVICE)
Bilirubin Urine: NEGATIVE
GLUCOSE, UA: 500 mg/dL — AB
Hgb urine dipstick: NEGATIVE
NITRITE: NEGATIVE
Protein, ur: NEGATIVE mg/dL
UROBILINOGEN UA: 1 mg/dL (ref 0.0–1.0)
pH: 6 (ref 5.0–8.0)

## 2016-12-12 LAB — GLUCOSE, CAPILLARY: Glucose-Capillary: 73 mg/dL (ref 65–99)

## 2016-12-12 NOTE — Patient Instructions (Addendum)
Informacin sobre Government social research officerel parto prematuro  (Preterm Labor Information)  Se llama parto prematuro cuando se inicia antes de las 37 semanas de Warrenembarazo. La duracin de un embarazo normal es de 39 a 41 semanas.  CAUSAS  Generalmente las causas del parto prematuro no se conocen. La causa ms frecuente conocida es una infeccin.  FACTORES DE RIESGO   Historia previa de parto prematuro.  Romper la bolsa de aguas antes de Kincheloetiempo.  La placenta cubre la abertura del cuello.  La placenta se despega del tero.  El cuello es demasiado dbil para contener al beb en el tero.  Hay mucho lquido en el saco amnitico.  Consumo de drogas o hbito de fumar durante el Arnoldembarazo.  No aumentar de peso lo suficiente durante el Big Lotsembarazo.  Mujeres menores de 18 aos o mayores de [address] [place] aos.  Tener bajos ingresos.  Pertenecer a Engineer, productionla raza afroamericana. SNTOMAS   Clicos similares a los menstruales, dolor en el vientre (abdominal) o dolor en la espalda.  Contracciones regulares, tan frecuentes como seis en Marshall & Ilsleyuna hora. Pueden ser suaves o dolorosas.  Contracciones que comienzan en la parte superior del vientre. Luego bajan hacia la zona inferior del vientre y Hilton Hotelshacia la espalda.  Presin en la zona inferior del vientre que Advertising account executiveparece empeorar.  Sangrado que proviene de la vagina.  Prdida de lquido por la vagina. TRATAMIENTO  El tratamiento depende de:   Su estado.  El New Waterfordestado del beb.  Cuntas semanas tiene de Hay Springsembarazo. El mdico podr indicarle:   Medicamentos para Print production plannerdetener las contracciones.  Que permanezca en la cama excepto para ir al bao (reposo en cama).  Que permanezca en el hospital. QU DEBE HACER SI PIENSA QUE EST EN TRABAJO DE PARTO PREMATURO?  Comunquese con su mdico de inmediato. Debe concurrir al hospital para ser controlada inmediatamente.  CMO PUEDE EVITAR EL TRABAJO DE PARTO PREMATURO EN FUTUROS EMBARAZOS?   Si fuma, abandone el hbito.  Mantenga un aumento de peso  saludable.  Notome drogas ni manipule sustancias qumicas que no necesita.  Informe a su mdico si piensa que tiene una infeccin.  Informe a su mdico si tuvo un trabajo de parto prematuro anteriormente. Esta informacin no tiene Theme park managercomo fin reemplazar el consejo del mdico. Asegrese de hacerle al mdico cualquier pregunta que tenga. Document Released: 04/27/2010 Document Revised: 11/25/2012 Elsevier Interactive Patient Education  2017 ArvinMeritorElsevier Inc.

## 2016-12-12 NOTE — Progress Notes (Signed)
Spanish interpreter "Raquel" [phone]

## 2016-12-12 NOTE — Progress Notes (Signed)
   PRENATAL VISIT NOTE  Subjective:  Aimee DunkerGloria Lopez is a 21 y.o. G2P0010 at 3256w3d being seen today for ongoing prenatal care.  She is currently monitored for the following issues for this high-risk pregnancy and has Gestational diabetes mellitus and Supervision of high risk pregnancy, antepartum on her problem list.  Patient reports no complaints.  Contractions: Not present. Vag. Bleeding: None.  Movement: Present. Denies leaking of fluid.   The following portions of the patient's history were reviewed and updated as appropriate: allergies, current medications, past family history, past medical history, past social history, past surgical history and problem list. Problem list updated.  Objective:   Vitals:   12/12/16 1259  BP: 110/62  Pulse: 86  Weight: 168 lb (76.2 kg)    Fetal Status: Fetal Heart Rate (bpm): 140   Movement: Present     General:  Alert, oriented and cooperative. Patient is in no acute distress.  Skin: Skin is warm and dry. No rash noted.   Cardiovascular: Normal heart rate noted  Respiratory: Normal respiratory effort, no problems with respiration noted  Abdomen: Soft, gravid, appropriate for gestational age. Pain/Pressure: Absent     Pelvic:  Cervical exam deferred        Extremities: Normal range of motion.  Edema: Trace  Mental Status: Normal mood and affect. Normal behavior. Normal judgment and thought content.   Assessment and Plan:  Pregnancy: G2P0010 at 6256w3d  1. Diet controlled gestational diabetes mellitus (GDM) in second trimester - Reported blood sugars are wnl, POC CBG 73 - POC UA significant for 500 of glucose, repeat UA at next visit and consider A1C - 28wk labs - MFM OB FOLLOW UP at 38wks   2. Supervision of high risk pregnancy, antepartum - Routine care  There are no diagnoses linked to this encounter. Preterm labor symptoms and general obstetric precautions including but not limited to vaginal bleeding, contractions, leaking of fluid and  fetal movement were reviewed in detail with the patient. Please refer to After Visit Summary for other counseling recommendations.  No Follow-up on file.  Dannette Barbararew Eziah Negro, Medical Student

## 2016-12-13 LAB — CBC
HEMATOCRIT: 36.7 % (ref 34.0–46.6)
Hemoglobin: 12.1 g/dL (ref 11.1–15.9)
MCH: 29.2 pg (ref 26.6–33.0)
MCHC: 33 g/dL (ref 31.5–35.7)
MCV: 89 fL (ref 79–97)
PLATELETS: 254 10*3/uL (ref 150–379)
RBC: 4.14 x10E6/uL (ref 3.77–5.28)
RDW: 14 % (ref 12.3–15.4)
WBC: 10.5 10*3/uL (ref 3.4–10.8)

## 2016-12-13 LAB — COMPREHENSIVE METABOLIC PANEL
A/G RATIO: 1.2 (ref 1.2–2.2)
ALT: 14 IU/L (ref 0–32)
AST: 16 IU/L (ref 0–40)
Albumin: 3.6 g/dL (ref 3.5–5.5)
Alkaline Phosphatase: 118 IU/L — ABNORMAL HIGH (ref 39–117)
BILIRUBIN TOTAL: 0.2 mg/dL (ref 0.0–1.2)
BUN/Creatinine Ratio: 26 — ABNORMAL HIGH (ref 9–23)
BUN: 9 mg/dL (ref 6–20)
CALCIUM: 8.8 mg/dL (ref 8.7–10.2)
CHLORIDE: 103 mmol/L (ref 96–106)
CO2: 20 mmol/L (ref 20–29)
Creatinine, Ser: 0.35 mg/dL — ABNORMAL LOW (ref 0.57–1.00)
GFR calc Af Amer: 182 mL/min/{1.73_m2} (ref >59)
GFR calc non Af Amer: 158 mL/min/{1.73_m2} (ref >59)
GLUCOSE: 64 mg/dL — AB (ref 65–99)
Globulin, Total: 3.1 g/dL (ref 1.5–4.5)
POTASSIUM: 3.7 mmol/L (ref 3.5–5.2)
Sodium: 138 mmol/L (ref 134–144)
Total Protein: 6.7 g/dL (ref 6.0–8.5)

## 2016-12-13 LAB — HIV ANTIBODY (ROUTINE TESTING W REFLEX): HIV Screen 4th Generation wRfx: NONREACTIVE

## 2016-12-13 LAB — RPR: RPR Ser Ql: NONREACTIVE

## 2016-12-15 ENCOUNTER — Inpatient Hospital Stay (HOSPITAL_COMMUNITY)
Admission: AD | Admit: 2016-12-15 | Discharge: 2016-12-16 | Disposition: A | Discharge status: Home / Self Care | Payer: Self-pay | Source: Ambulatory Visit | Attending: Obstetrics and Gynecology | Admitting: Obstetrics and Gynecology

## 2016-12-15 DIAGNOSIS — O26893 Other specified pregnancy related conditions, third trimester: Secondary | ICD-10-CM | POA: Insufficient documentation

## 2016-12-15 DIAGNOSIS — O99891 Other specified diseases and conditions complicating pregnancy: Secondary | ICD-10-CM

## 2016-12-15 DIAGNOSIS — Z3A30 30 weeks gestation of pregnancy: Secondary | ICD-10-CM | POA: Insufficient documentation

## 2016-12-15 DIAGNOSIS — O9989 Other specified diseases and conditions complicating pregnancy, childbirth and the puerperium: Secondary | ICD-10-CM

## 2016-12-15 DIAGNOSIS — R110 Nausea: Secondary | ICD-10-CM

## 2016-12-15 DIAGNOSIS — M545 Low back pain: Secondary | ICD-10-CM | POA: Insufficient documentation

## 2016-12-15 DIAGNOSIS — O0990 Supervision of high risk pregnancy, unspecified, unspecified trimester: Secondary | ICD-10-CM

## 2016-12-15 DIAGNOSIS — R1033 Periumbilical pain: Secondary | ICD-10-CM

## 2016-12-15 DIAGNOSIS — M549 Dorsalgia, unspecified: Secondary | ICD-10-CM

## 2016-12-15 DIAGNOSIS — J069 Acute upper respiratory infection, unspecified: Secondary | ICD-10-CM | POA: Insufficient documentation

## 2016-12-15 DIAGNOSIS — R1030 Lower abdominal pain, unspecified: Secondary | ICD-10-CM | POA: Insufficient documentation

## 2016-12-15 DIAGNOSIS — Z87891 Personal history of nicotine dependence: Secondary | ICD-10-CM | POA: Insufficient documentation

## 2016-12-15 DIAGNOSIS — R0981 Nasal congestion: Secondary | ICD-10-CM | POA: Insufficient documentation

## 2016-12-15 DIAGNOSIS — O99513 Diseases of the respiratory system complicating pregnancy, third trimester: Secondary | ICD-10-CM | POA: Insufficient documentation

## 2016-12-15 DIAGNOSIS — O24410 Gestational diabetes mellitus in pregnancy, diet controlled: Secondary | ICD-10-CM

## 2016-12-16 ENCOUNTER — Encounter (HOSPITAL_COMMUNITY): Payer: Self-pay

## 2016-12-16 DIAGNOSIS — O26893 Other specified pregnancy related conditions, third trimester: Secondary | ICD-10-CM

## 2016-12-16 DIAGNOSIS — R1033 Periumbilical pain: Secondary | ICD-10-CM

## 2016-12-16 LAB — URINALYSIS, ROUTINE W REFLEX MICROSCOPIC
BILIRUBIN URINE: NEGATIVE
Glucose, UA: NEGATIVE mg/dL
Hgb urine dipstick: NEGATIVE
KETONES UR: NEGATIVE mg/dL
Nitrite: NEGATIVE
PROTEIN: NEGATIVE mg/dL
RBC / HPF: NONE SEEN RBC/hpf (ref 0–5)
Specific Gravity, Urine: 1.02 (ref 1.005–1.030)
pH: 5 (ref 5.0–8.0)

## 2016-12-16 MED ORDER — SALINE SPRAY 0.65 % NA SOLN: 1.0000 | NASAL | 0 refills | Status: DC | PRN

## 2016-12-16 MED ORDER — PSEUDOEPHEDRINE HCL 30 MG PO TABS
60.0000 mg | ORAL_TABLET | Freq: Once | ORAL | Status: AC
  Administered 2016-12-16: 60 mg via ORAL
  Filled 2016-12-16: qty 2

## 2016-12-16 MED ORDER — PROMETHAZINE HCL 25 MG PO TABS: 25.0000 mg | ORAL_TABLET | Freq: Four times a day (QID) | ORAL | 2 refills | Status: DC | PRN

## 2016-12-16 MED ORDER — PROMETHAZINE HCL 25 MG PO TABS
25.0000 mg | ORAL_TABLET | Freq: Once | ORAL | Status: AC
  Administered 2016-12-16: 25 mg via ORAL
  Filled 2016-12-16: qty 1

## 2016-12-16 NOTE — MAU Provider Note (Signed)
Chief Complaint:  Abdominal Pain and Back Pain  Provider saw patient at 0035   HPI: Aimee Lopez is a 21 y.o. G2P0010 at 14w0dwho presents to maternity admissions reporting mid abdominal cramping, near umbilicus.  No tightening with pain.  Has new onset of nausea since 8pm.  No diarrhea.  No fever.   Also complains of nasal congestion for 2 days She reports good fetal movement, denies LOF, vaginal bleeding, vaginal itching/burning, urinary symptoms, h/a, dizziness, diarrhea, constipation or fever/chills.    Abdominal Pain  This is a new problem. The current episode started today. The onset quality is gradual. The problem occurs intermittently. The problem has been unchanged. The pain is located in the periumbilical region. The pain is mild. The quality of the pain is colicky and cramping. The abdominal pain radiates to the back. Associated symptoms include nausea. Pertinent negatives include no constipation, diarrhea, dysuria, fever, frequency, headaches, myalgias or vomiting. Nothing aggravates the pain. The pain is relieved by nothing. She has tried nothing for the symptoms.  Back Pain  Associated symptoms include abdominal pain. Pertinent negatives include no dysuria, fever or headaches.   RN Note: Pt c/o lower back pain and lower abdominal pain that started today at 8pm. Pt c/o nausea that started at 7pm. Pt states baby is moving normally. Pt denies bleeding.  Past Medical History: Past Medical History:  Diagnosis Date  . Medical history non-contributory     Past obstetric history: OB History  Gravida Para Term Preterm AB Living  2 0 0 0 1 0  SAB TAB Ectopic Multiple Live Births  1 0 0 0 0    # Outcome Date GA Lbr Len/2nd Weight Sex Delivery Anes PTL Lv  2 Current           1 SAB 2016              Past Surgical History: Past Surgical History:  Procedure Laterality Date  . NO PAST SURGERIES      Family History: Family History  Problem Relation Age of Onset  . Diabetes  Mother   . Diabetes Father   . Diabetes Maternal Aunt     Social History: Social History  Substance Use Topics  . Smoking status: Former Smoker    Quit date: 06/17/2016  . Smokeless tobacco: Never Used  . Alcohol use No    Allergies: No Known Allergies  Meds:  Prescriptions Prior to Admission  Medication Sig Dispense Refill Last Dose  . ACCU-CHEK FASTCLIX LANCETS MISC 1 Device by Percutaneous route 4 (four) times daily. 100 each 12 Taking  . glucose blood (ACCU-CHEK GUIDE) test strip Use as instructed 100 each 12 Taking  . Prenatal Vit-Fe Fumarate-FA (PREPLUS) 27-1 MG TABS Take 1 tablet by mouth daily. 30 tablet 13 Taking    I have reviewed patient's Past Medical Hx, Surgical Hx, Family Hx, Social Hx, medications and allergies.   ROS:  Review of Systems  Constitutional: Negative for fever.  HENT: Positive for congestion, rhinorrhea and sinus pressure. Negative for ear pain and sore throat.   Gastrointestinal: Positive for abdominal pain and nausea. Negative for constipation, diarrhea and vomiting.  Genitourinary: Negative for dysuria and frequency.  Musculoskeletal: Positive for back pain. Negative for myalgias.  Neurological: Negative for headaches.   Other systems negative  Physical Exam  Patient Vitals for the past 24 hrs:  BP Temp Temp src Pulse Resp SpO2 Height Weight  12/16/16 0003 124/61 98.4 F (36.9 C) Oral 96 18 99 % 5'  1.5" (1.562 m) 188 lb 4 oz (85.4 kg)   Constitutional: Well-developed, well-nourished female in no acute distress.  Cardiovascular: normal rate and rhythm Respiratory: normal effort, clear to auscultation bilaterally GI: Abd soft, non-tender, gravid appropriate for gestational age.   No rebound or guarding. MS: Extremities nontender, no edema, normal ROM Neurologic: Alert and oriented x 4.  GU: Neg CVAT.  PELVIC EXAM:  Dilation: Closed Effacement (%): 50 Exam by:: Wynelle BourgeoisMarie Spiro Ausborn, CNM  FHT:  Baseline 140 , moderate variability,  accelerations present, no decelerations Contractions:  Rare   Labs: Results for orders placed or performed during the hospital encounter of 12/15/16 (from the past 24 hour(s))  Urinalysis, Routine w reflex microscopic     Status: Abnormal   Collection Time: 12/15/16 11:58 PM  Result Value Ref Range   Color, Urine YELLOW YELLOW   APPearance CLOUDY (A) CLEAR   Specific Gravity, Urine 1.020 1.005 - 1.030   pH 5.0 5.0 - 8.0   Glucose, UA NEGATIVE NEGATIVE mg/dL   Hgb urine dipstick NEGATIVE NEGATIVE   Bilirubin Urine NEGATIVE NEGATIVE   Ketones, ur NEGATIVE NEGATIVE mg/dL   Protein, ur NEGATIVE NEGATIVE mg/dL   Nitrite NEGATIVE NEGATIVE   Leukocytes, UA MODERATE (A) NEGATIVE   RBC / HPF NONE SEEN 0 - 5 RBC/hpf   WBC, UA 6-30 0 - 5 WBC/hpf   Bacteria, UA RARE (A) NONE SEEN   Squamous Epithelial / LPF 6-30 (A) NONE SEEN   Mucus PRESENT    Ca Oxalate Crys, UA PRESENT    B/Positive/-- (04/16 0000)  Imaging:  No results found.  MAU Course/MDM: I have ordered labs and reviewed results. Urine showed leukocytes but is not a good Clean catch NST reviewed and found to be reassuring  Treatments in MAU included Sudafed for congestion and Phenergan for nausea She did get good relief of congestion and nausea.  States abdominal and back pain are completely gone. I suspect since the nausea is new, with periumbilical cramping, may have early gastroenteritis.    Assessment: 1. Diet controlled gestational diabetes mellitus (GDM) in second trimester   2. Supervision of high risk pregnancy, antepartum   3.     Abdominal cramping, resolved 4.     Back pain resolved 5.      URI/nasal congestion, improved  Plan: Discharge home Preterm Labor precautions and fetal kick counts Follow up in Office for prenatal visits and recheck of status Rx Nasal saline spray for congestion Rx Phenergan for nausea  Encouraged to return here or to other Urgent Care/ED if she develops worsening of symptoms,  increase in pain, fever, or other concerning symptoms.   Pt stable at time of discharge.  Wynelle BourgeoisMarie Breck [place] CNM, MSN Certified Nurse-Midwife 12/16/2016 1:00 AM

## 2016-12-16 NOTE — Discharge Instructions (Signed)
Nuseas en los adultos (Nausea, Adult) Tener nuseas significa sentir Programme researcher, broadcasting/film/video o ganas de vomitar. Las nuseas en s a menudo no revisten gravedad, pero pueden ser un signo temprano de problemas mdicos ms graves. Si las nuseas empeoran, pueden ocasionar vmitos. Si vomita o si no puede beber suficiente lquido, corre el riesgo de deshidratarse. La deshidratacin puede hacer que se sienta cansado y sediento, que tenga la boca seca y que orine con menos frecuencia. Los [employer] y las personas que tienen otras enfermedades o un sistema de defensa (inmunitario) dbil tienen mayor riesgo de sufrir deshidratacin. El [place] principal del tratamiento de esta afeccin es lo siguiente:  Limitar la frecuencia con la que siente nuseas.  Prevenir los vmitos y la deshidratacin. CUIDADOS EN EL HOGAR Siga las indicaciones del mdico sobre cmo debe cuidarse en su casa. Comida y bebida Siga estas recomendaciones como se lo haya indicado el mdico:  Tome una solucin de rehidratacin oral (SRO). Es Neomia Dear bebida que se vende en farmacias y tiendas.  Beba lquidos claros en pequeas cantidades segn le sea posible. Por ejemplo: ? [place]. ? Cubitos de hielo. ? Jugo de frutas con agua agregada (jugo de frutas diluido). ? Bebidas deportivas de bajas caloras.  En la medida en que pueda, consuma alimentos blandos y fciles de digerir en pequeas cantidades. Por ejemplo: ? Bananas. ? Pur de [employer]. ? Arroz. ? BJ's. ? Tostadas. ? Galletas.  Evite beber lquidos que contengan mucha azcar o cafena.  Evite el alcohol.  Evite los alimentos picantes o grasos. Instrucciones generales  Beba suficiente lquido para mantener el pis (orina) claro o de color amarillo plido.  Lvese las manos con frecuencia. Use un desinfectante para manos si no dispone de [place] y [place].  Asegrese de que todas las personas que viven en su casa se laven bien las manos y con  frecuencia.  Descanse en su casa hasta sentirse mejor.  Tome los medicamentos de venta libre y los recetados solamente como se lo haya indicado el mdico.  Cuando sienta nuseas, respire lenta y profundamente.  Controle su afeccin para ver si hay cambios.  Concurra a todas las visitas de control como se lo haya indicado el mdico. Esto es importante. SOLICITE AYUDA SI:  Tiene dolores de [place].  Aparecen nuevos sntomas.  El Museum/gallery curator.  Tiene fiebre.  Se siente mareado o siente que va a desvanecerse.  Vomita  No puede retener los lquidos. SOLICITE AYUDA DE INMEDIATO SI:  Siente dolor en el pecho, el cuello, los brazos o la [place].  Se siente muy dbil o se desvanece (se desmaya).  Vomita sangre de color rojo brillante o el vmito se asemeja al poso del caf.  Tiene heces con sangre, de color negro, o con aspecto alquitranado.  Siente un dolor de cabeza intenso, rigidez en el cuello, o ambas cosas.  Tiene dolor muy intenso en el vientre, clicos o meteorismo.  Tiene una erupcin cutnea.  Le cuesta respirar o respira muy rpidamente.  Su corazn late muy rpidamente.  Siente la piel fra y hmeda.  Se siente confundido.  Siente dolor al [employer].  Tiene signos de deshidratacin, por ejemplo: ? Larose Kells, muy escasa o [place]. ? Labios agrietados. ? [employer]. ? Ojos hundidos. ? Somnolencia. ? Debilidad. Estos sntomas pueden Customer service manager. No espere hasta que los sntomas desaparezcan. Solicite atencin mdica de inmediato. Comunquese con el servicio de emergencias de su localidad (911 en los [place]). No conduzca  por sus propios medios [employer]. Esta informacin no tiene Theme park manager el consejo del mdico. Asegrese de hacerle al mdico cualquier pregunta que tenga. Document Released: 03/14/2011 Document Revised: 07/17/2015 Document Reviewed: 11/29/2014 Elsevier Interactive Patient  Education  2018 [employer]. Infeccin del tracto respiratorio superior, adultos (Upper Respiratory Infection, Adult) La mayora de las infecciones del tracto respiratorio superior son infecciones virales de las vas que llevan el aire a los pulmones. Un infeccin del tracto respiratorio superior afecta la nariz, la garganta y las vas respiratorias superiores. El tipo ms frecuente de infeccin del tracto respiratorio superior es la nasofaringitis, que habitualmente se conoce como "resfro comn". Las infecciones del tracto respiratorio superior siguen su curso y por lo general se curan solas. En la [employer], la infeccin del tracto respiratorio superior no requiere atencin [place], Biomedical engineer a veces, despus de una infeccin viral, puede surgir una infeccin bacteriana en las vas respiratorias superiores. Esto se conoce como infeccin secundaria. Las infecciones sinusales y en el odo medio son tipos frecuentes de infecciones secundarias en el tracto respiratorio superior. La neumona bacteriana tambin puede complicar un cuadro de infeccin del tracto respiratorio superior. Este tipo de infeccin puede empeorar el asma y la enfermedad pulmonar obstructiva crnica (EPOC). En algunos casos, estas complicaciones pueden requerir atencin mdica de emergencia y poner en peligro la vida. CAUSAS Casi todas las infecciones del tracto respiratorio superior se deben a los virus. Un virus es un tipo de microbio que puede contagiarse de Neomia Dear persona a Educational psychologist. FACTORES DE RIESGO Puede estar en riesgo de sufrir una infeccin del tracto respiratorio superior si:  Fuma.  Tiene una enfermedad pulmonar o cardaca crnica.  Tiene debilitado el sistema de defensa (inmunitario) del cuerpo.  Es [address] o de edad muy [place].  Tiene asma o alergias nasales.  Trabaja en reas donde hay mucha gente o poca ventilacin.  Rudi Coco en una escuela o en un centro de atencin mdica. SIGNOS Y  SNTOMAS Habitualmente, los sntomas aparecen de 2a 3das despus de entrar en contacto con el virus del resfro. La mayora de las infecciones virales en el tracto respiratorio superior duran de 7a 10das. Sin embargo, las infecciones virales en el tracto respiratorio superior a causa del virus de la gripe pueden durar de 14a 18das y, habitualmente, son ms graves. Entre los sntomas se pueden incluir los siguientes:  Secrecin o congestin nasal.  Estornudos.  Tos.  Dolor de Advertising copywriter.  Dolor de [place].  Fatiga.  Grant Ruts.  Prdida del apetito.  Dolor en la frente, detrs de los ojos y por encima de los pmulos (dolor sinusal).  Dolores musculares. DIAGNSTICO El mdico puede diagnosticar una infeccin del tracto respiratorio superior mediante los siguientes estudios:  Examen fsico.  Pruebas para verificar si los sntomas no se deben a otra afeccin, por ejemplo: ? Faringitis estreptoccica. ? Sinusitis. ? [place]umona. ? Asma. TRATAMIENTO Esta infeccin desaparece sola, con el tiempo. No puede curarse con medicamentos, pero a menudo se prescriben para aliviar los sntomas. Los medicamentos pueden ser tiles para lo siguiente:  Personal assistant fiebre.  Reducir la tos.  Aliviar la congestin nasal. INSTRUCCIONES PARA EL CUIDADO EN EL HOGAR  Tome los medicamentos solamente como se lo haya indicado el mdico.  A fin de Engineer, materials de garganta, haga grgaras con solucin salina templada o consuma caramelos para la tos, como se lo haya indicado el mdico.  Use un humidificador de vapor clido o inhale el vapor de la ducha para  aumentar la humedad del aire. Esto facilitar la respiracin.  Beba suficiente lquido para Photographermantener la orina clara o de color amarillo plido.  Consuma sopas y otros caldos transparentes, y Abbott Laboratoriesalimntese bien.  Descanse todo lo que sea necesario.  Regrese al Aleen Campitrabajo cuando la temperatura se le haya normalizado o cuando el mdico lo autorice. Es  posible que deba quedarse en su casa durante un tiempo prolongado, para no infectar a los dems. Tambin puede usar un barbijo y lavarse las manos con cuidado para Transport plannerevitar la propagacin del virus.  Aumente el uso del inhalador si tiene asma.  No consuma ningn producto que contenga tabaco, lo que incluye cigarrillos, tabaco de Theatre managermascar o Administrator, Civil Servicecigarrillos electrnicos. Si necesita ayuda para dejar de fumar, consulte al American Expressmdico.  PREVENCIN La mejor manera de protegerse de un resfro es mantener una higiene Cougaradecuada.  Evite el contacto oral o fsico con personas que tengan sntomas de resfro.  En caso de contacto, lvese las manos con frecuencia. No hay pruebas claras de que la vitaminaC, la vitaminaE, la equincea o el ejercicio reduzcan la probabilidad de Primary school teachercontraer un resfro. Sin embargo, siempre se recomienda Insurance account managerdescansar mucho, hacer ejercicio y Engineering geologistalimentarse bien. SOLICITE ATENCIN MDICA SI:  Su estado empeora en lugar de mejorar.  Los medicamentos no Estate agentlogran controlar los sntomas.  Tiene escalofros.  La sensacin de falta de aire empeora.  Tiene mucosidad marrn o roja.  Tiene secrecin nasal amarilla o marrn.  Le duele la cara, especialmente al inclinarse hacia adelante.  Tiene fiebre.  Tiene los ganglios del cuello hinchados.  Siente dolor al tragar.  Tiene zonas blancas en la parte de atrs de la garganta.  SOLICITE ATENCIN MDICA DE INMEDIATO SI:  Tiene sntomas intensos o persistentes de: ? Dolor de [place]abeza. ? Dolor de odos. ? Dolor sinusal. ? Journalist, newspaperDolor en el pecho.  Tiene enfermedad pulmonar crnica y cualquiera de estos sntomas: ? Sibilancias. ? Tos prolongada. ? Tos con Montez Hagemansangre. ? Cambio en la mucosidad habitual.  Presenta rigidez en el cuello.  Tiene cambios en: ? La visin. ? La audicin. ? El pensamiento. ? El Moscowestado de nimo.  ASEGRESE DE QUE:  Comprende estas instrucciones.  Controlar su afeccin.  Recibir ayuda de inmediato si no mejora o si  empeora.  Esta informacin no tiene Theme park managercomo fin reemplazar el consejo del mdico. Asegrese de hacerle al mdico cualquier pregunta que tenga. Document Released: 01/02/2005 Document Revised: 08/09/2014 Document Reviewed: 06/30/2013 Elsevier Interactive Patient Education  2017 [employer]

## 2016-12-16 NOTE — MAU Note (Signed)
Pt c/o lower back pain and lower abdominal pain that started today at 8pm. Pt c/o nausea that started at 7pm. Pt states baby is moving normally. Pt denies bleeding.

## 2016-12-22 ENCOUNTER — Encounter (HOSPITAL_COMMUNITY): Payer: Self-pay | Admitting: *Deleted

## 2016-12-22 ENCOUNTER — Inpatient Hospital Stay (HOSPITAL_COMMUNITY)
Admission: AD | Admit: 2016-12-22 | Discharge: 2016-12-22 | Disposition: A | Discharge status: Home / Self Care | Payer: Self-pay | Source: Ambulatory Visit | Attending: Obstetrics & Gynecology | Admitting: Obstetrics & Gynecology

## 2016-12-22 ENCOUNTER — Inpatient Hospital Stay (HOSPITAL_COMMUNITY): Payer: Self-pay

## 2016-12-22 DIAGNOSIS — N760 Acute vaginitis: Secondary | ICD-10-CM | POA: Insufficient documentation

## 2016-12-22 DIAGNOSIS — O24410 Gestational diabetes mellitus in pregnancy, diet controlled: Secondary | ICD-10-CM | POA: Insufficient documentation

## 2016-12-22 DIAGNOSIS — O26893 Other specified pregnancy related conditions, third trimester: Secondary | ICD-10-CM | POA: Insufficient documentation

## 2016-12-22 DIAGNOSIS — B9689 Other specified bacterial agents as the cause of diseases classified elsewhere: Secondary | ICD-10-CM | POA: Insufficient documentation

## 2016-12-22 DIAGNOSIS — Z79899 Other long term (current) drug therapy: Secondary | ICD-10-CM | POA: Insufficient documentation

## 2016-12-22 DIAGNOSIS — O368130 Decreased fetal movements, third trimester, not applicable or unspecified: Secondary | ICD-10-CM | POA: Insufficient documentation

## 2016-12-22 DIAGNOSIS — Z87891 Personal history of nicotine dependence: Secondary | ICD-10-CM | POA: Insufficient documentation

## 2016-12-22 DIAGNOSIS — Z3A30 30 weeks gestation of pregnancy: Secondary | ICD-10-CM | POA: Insufficient documentation

## 2016-12-22 DIAGNOSIS — Z833 Family history of diabetes mellitus: Secondary | ICD-10-CM | POA: Insufficient documentation

## 2016-12-22 DIAGNOSIS — O4693 Antepartum hemorrhage, unspecified, third trimester: Secondary | ICD-10-CM | POA: Insufficient documentation

## 2016-12-22 LAB — WET PREP, GENITAL
Sperm: NONE SEEN
TRICH WET PREP: NONE SEEN
Yeast Wet Prep HPF POC: NONE SEEN

## 2016-12-22 LAB — URINALYSIS, ROUTINE W REFLEX MICROSCOPIC
BILIRUBIN URINE: NEGATIVE
Glucose, UA: >=500 mg/dL — AB
KETONES UR: NEGATIVE mg/dL
Nitrite: NEGATIVE
PROTEIN: NEGATIVE mg/dL
Specific Gravity, Urine: 1.018 (ref 1.005–1.030)
pH: 6 (ref 5.0–8.0)

## 2016-12-22 LAB — GLUCOSE, CAPILLARY: Glucose-Capillary: 103 mg/dL — ABNORMAL HIGH (ref 65–99)

## 2016-12-22 MED ORDER — METRONIDAZOLE 500 MG PO TABS: 500.0000 mg | ORAL_TABLET | Freq: Two times a day (BID) | ORAL | 0 refills | Status: DC

## 2016-12-22 NOTE — Progress Notes (Addendum)
1745: Assumed care of pt from RN Roxan Hockey. Pt to U/S via wheelchair. Interpreter notified and will meet at the U/S unit.   G2P0 @ 30.[redacted] wksga. Present to triage for bloody mucus on the toilet paper when wiping and head cold. Denies LOF. + FM felt. EFM applied by previous nurse along with wetprep and GC. Denies recent intercourse and denies pain.   1747: Pt to U/S Returned from U/S  1927: Provider reviewed prelim U/S report.  1932: Interpreter paged.   1935: interpreter in unit. Discharge instructions given with pt understanding pt left unit via ambulatory with family.   2047: Provider at bs reassessing pt with results with interpreter at bs. Discharge instruction given for followup care with OB

## 2016-12-22 NOTE — Discharge Instructions (Signed)
Hemorragia vaginal durante el embarazo (tercer trimestre) (Vaginal Bleeding During Pregnancy, Third Trimester) Durante el embarazo, es comn tener una pequea hemorragia vaginal (manchas). A veces, la hemorragia es normal y no representa un problema, pero en algunas ocasiones es un sntoma de algo grave. Asegrese de decirle a su mdico de inmediato si tiene algn tipo de hemorragia vaginal. CUIDADOS EN EL HOGAR  Controle su afeccin para ver si hay cambios.  Siga las indicaciones de su mdico con respecto al Ali Chukson de actividad que Ulen.  Si debe hacer reposo en cama: ? Es posible que deba quedarse en cama y levantarse nicamente para ir al bao. ? Equities trader [employer]. ? Si es necesario, planifique que alguien la ayude.  Marcelino Freestone: ? La cantidad de toallas higinicas que [place] cada da. ? La frecuencia con la que se cambia las toallas higinicas. ? Indique que tan empapados (saturados) estn.  No use tampones.  No se haga duchas vaginales.  No tenga relaciones sexuales ni orgasmos hasta que el mdico la autorice.  Siga los consejos de su mdico acerca de levantar objetos pesados, conducir automviles y Education officer, environmental actividad fsica.  Si elimina tejido por la vagina, gurdelo para mostrrselo al [employer].  Tome los medicamentos solamente como se lo haya indicado el mdico.  No tome aspirina, ya que puede causar hemorragias.  Concurra a todas las visitas de control como se lo haya indicado el mdico. SOLICITE AYUDA SI:  Tiene una hemorragia vaginal.  Tiene clicos.  Tiene dolores de [place].  Tiene fiebre que no desaparece despus de Teacher, adult education. SOLICITE AYUDA DE INMEDIATO SI:  Siente clicos muy intensos en la espalda o en el vientre (abdomen).  Tiene escalofros.  Tiene una prdida de lquido por la vagina.  Elimina cogulos grandes o tejido por la vagina.  Tiene ms hemorragia.  Se siente dbil o que va a desvanecerse.  Pierde el  conocimiento (se desmaya).  No siente que el beb se mueva tanto como antes. ASEGRESE DE QUE:  Comprende estas instrucciones.  Controlar su afeccin.  Recibir ayuda de inmediato si no mejora o si empeora. Esta informacin no tiene Theme park manager el consejo del mdico. Asegrese de hacerle al mdico cualquier pregunta que tenga. Document Released: 08/09/2013 Document Revised: 08/09/2013 Document Reviewed: 11/30/2012 Elsevier Interactive Patient Education  [employer].

## 2016-12-22 NOTE — MAU Note (Signed)
Pinkish discharged since yesterday; more noticeable when wipes Denies any pain Denies and recent intercourse in the past 72 hours  +FM Denies LOF

## 2016-12-22 NOTE — MAU Provider Note (Signed)
History     CSN: [id number]  Arrival date and time: 12/22/16 1619   First Provider Initiated Contact with Patient 12/22/16 1717    Spanish interpreter present for visit  No chief complaint on file.  Aimee Lopez is a 21 y.o. G2P0010 at [redacted]w[redacted]d presenting with pink vaginal spotting since yesterday. She notices blood on the toilet paper but has not needed to wear peri-pad. Last noted pink spotting at 1400. This is her first episode of vaginal bleeding. Denies any associated abdominal cramping or pain. No leakage of fluid. No antecedent intercourse (not sexually active during pregnancy). Also reporting no fetal movement perceived all day.  Pregnancy complicated by A1 gestational diabetes.      OB History  Gravida Para Term Preterm AB Living  2 0 0 0 1 0  SAB TAB Ectopic Multiple Live Births  1 0 0 0 0    # Outcome Date GA Lbr Len/2nd Weight Sex Delivery Anes PTL Lv  2 Current           1 SAB 2016               Past Medical History:  Diagnosis Date  . Medical history non-contributory     Past Surgical History:  Procedure Laterality Date  . NO PAST SURGERIES      Family History  Problem Relation Age of Onset  . Diabetes Mother   . Diabetes Father   . Diabetes Maternal Aunt     Social History  Substance Use Topics  . Smoking status: Former Smoker    Quit date: 06/17/2016  . Smokeless tobacco: Never Used  . Alcohol use No    Allergies: No Known Allergies  Prescriptions Prior to Admission  Medication Sig Dispense Refill Last Dose  . ACCU-CHEK FASTCLIX LANCETS MISC 1 Device by Percutaneous route 4 (four) times daily. 100 each 12 Taking  . glucose blood (ACCU-CHEK GUIDE) test strip Use as instructed 100 each 12 Taking  . Prenatal Vit-Fe Fumarate-FA (PREPLUS) 27-1 MG TABS Take 1 tablet by mouth daily. 30 tablet 13 Taking  . promethazine (PHENERGAN) 25 MG tablet Take 1 tablet (25 mg total) by mouth every 6 (six) hours as needed for nausea or vomiting. 30 tablet 2    . sodium chloride (OCEAN) 0.65 % SOLN nasal spray Place 1 spray into both nostrils as needed for congestion. 1 Bottle 0     Review of Systems  Constitutional: Negative for activity change, appetite change and fever.  HENT: Positive for rhinorrhea and sneezing.   Respiratory: Negative for cough, chest tightness and shortness of breath.   Gastrointestinal: Negative for abdominal pain, anal bleeding, blood in stool, constipation, diarrhea, nausea and vomiting.  Genitourinary: Positive for vaginal bleeding. Negative for dysuria, flank pain, frequency, hematuria, urgency, vaginal discharge and vaginal pain.  Musculoskeletal: Negative for back pain.  Psychiatric/Behavioral: The patient is not nervous/anxious.    Physical Exam   Last menstrual period 05/20/2016, unknown if currently breastfeeding.  Physical Exam  Nursing note and vitals reviewed. Constitutional: She is oriented to person, place, and time. She appears well-developed and well-nourished. No distress.  HENT:  Head: Normocephalic.  Eyes: No scleral icterus.  Neck: Neck supple.  Cardiovascular: Normal rate.   Respiratory: Effort normal.  GI: There is no tenderness. There is no guarding.  Fundus 1 fb above costal margin, NT  Genitourinary: Vaginal discharge found.  Genitourinary Comments: NEFG Speculum: Vagina with scant pink blood and white discharge, cervix appears clean SVE: Soft, posterior,  external 1 internal closed, 20%, -3 cephalic   Musculoskeletal: Normal range of motion.  Neurological: She is alert and oriented to person, place, and time.  Skin: Skin is warm and dry.  Psychiatric: She has a normal mood and affect. Her behavior is normal.    MAU Course  Procedures   Results for orders placed or performed during the hospital encounter of 12/22/16 (from the past 24 hour(s))  Urinalysis, Routine w reflex microscopic     Status: Abnormal   Collection Time: 12/22/16  5:09 PM  Result Value Ref Range   Color,  Urine YELLOW YELLOW   APPearance HAZY (A) CLEAR   Specific Gravity, Urine 1.018 1.005 - 1.030   pH 6.0 5.0 - 8.0   Glucose, UA >=500 (A) NEGATIVE mg/dL   Hgb urine dipstick LARGE (A) NEGATIVE   Bilirubin Urine NEGATIVE NEGATIVE   Ketones, ur NEGATIVE NEGATIVE mg/dL   Protein, ur NEGATIVE NEGATIVE mg/dL   Nitrite NEGATIVE NEGATIVE   Leukocytes, UA MODERATE (A) NEGATIVE   RBC / HPF 6-30 0 - 5 RBC/hpf   WBC, UA 6-30 0 - 5 WBC/hpf   Bacteria, UA RARE (A) NONE SEEN   Squamous Epithelial / LPF 6-30 (A) NONE SEEN   Mucus PRESENT   Wet prep, genital     Status: Abnormal   Collection Time: 12/22/16  5:30 PM  Result Value Ref Range   Yeast Wet Prep HPF POC NONE SEEN NONE SEEN   Trich, Wet Prep NONE SEEN NONE SEEN   Clue Cells Wet Prep HPF POC PRESENT (A) NONE SEEN   WBC, Wet Prep HPF POC MANY (A) NONE SEEN   Sperm NONE SEEN   Glucose, capillary     Status: Abnormal   Collection Time: 12/22/16  7:22 PM  Result Value Ref Range   Glucose-Capillary 103 (H) 65 - 99 mg/dL  Urine culture sent  Fetal monitoring Baseline heart rate 140-145, moderate variability, accelerations present, no decelerations  Toco: Occasional mild UC  Prelim [place] result: Placenta anterior, above os, AFI 11.44, CL 3.5cm; BPP 8/8 Since going to [place] she has been perceiving good fetal activity  Will discharge home with PTL precautions and pelvic rest. Return immediately for heavy bleeding or abdominal pain.  Assessment and Plan   1. Decreased fetal movement affecting management of pregnancy in third trimester   2. Vaginal bleeding in pregnancy, third trimester   3. BV (bacterial vaginosis)    Allergies as of 12/22/2016   No Known Allergies     Medication List    STOP taking these medications   promethazine 25 MG tablet Commonly known as:  PHENERGAN     TAKE these medications   ACCU-CHEK FASTCLIX LANCETS Misc 1 Device by Percutaneous route 4 (four) times daily.   glucose blood test strip Commonly known as:   ACCU-CHEK GUIDE Use as instructed   metroNIDAZOLE 500 MG tablet Commonly known as:  FLAGYL Take 1 tablet (500 mg total) by mouth 2 (two) times daily.   PREPLUS 27-1 MG Tabs Take 1 tablet by mouth daily.   sodium chloride 0.65 % Soln nasal spray Commonly known as:  OCEAN Place 1 spray into both nostrils as needed for congestion.            Discharge Care Instructions        Start     Ordered   12/22/16 0000  metroNIDAZOLE (FLAGYL) 500 MG tablet  2 times daily    Question:  Supervising Provider  Answer:  HARRAWAY-SMITH, CAROLYN   12/22/16 1953     Follow-up Information    [facility].   Specialty:  Obstetrics and Gynecology Contact information: [address] [place] [address] [phone]          Arantxa Piercey CNM 12/22/2016, 5:19 PM

## 2016-12-22 NOTE — MAU Note (Signed)
Patient also c/o congestion and a "head cold". x1 week

## 2016-12-24 LAB — CULTURE, OB URINE: Culture: <10000 — AB

## 2017-01-01 ENCOUNTER — Ambulatory Visit (INDEPENDENT_AMBULATORY_CARE_PROVIDER_SITE_OTHER): Payer: Self-pay | Admitting: Advanced Practice Midwife

## 2017-01-01 DIAGNOSIS — O0993 Supervision of high risk pregnancy, unspecified, third trimester: Secondary | ICD-10-CM

## 2017-01-01 DIAGNOSIS — O0990 Supervision of high risk pregnancy, unspecified, unspecified trimester: Secondary | ICD-10-CM

## 2017-01-01 DIAGNOSIS — Z23 Encounter for immunization: Secondary | ICD-10-CM

## 2017-01-01 NOTE — Progress Notes (Signed)
   PRENATAL VISIT NOTE  Subjective:  Aimee Lopez is a 21 y.o. G2P0010 at [redacted]w[redacted]d being seen today for ongoing prenatal care.  She is currently monitored for the following issues for this high-risk pregnancy and has Gestational diabetes mellitus and Supervision of high risk pregnancy, antepartum on her problem list.  Patient reports no complaints.  Contractions: Not present. Vag. Bleeding: None.  Movement: Present. Denies leaking of fluid.   The following portions of the patient's history were reviewed and updated as appropriate: allergies, current medications, past family history, past medical history, past social history, past surgical history and problem list. Problem list updated.  Objective:   Vitals:   01/01/17 1037  BP: (!) 114/55  Pulse: 80  Weight: 191 lb 9.6 oz (86.9 kg)    Fetal Status: Fetal Heart Rate (bpm): 138 Fundal Height: 31 cm Movement: Present     General:  Alert, oriented and cooperative. Patient is in no acute distress.  Skin: Skin is warm and dry. No rash noted.   Cardiovascular: Normal heart rate noted  Respiratory: Normal respiratory effort, no problems with respiration noted  Abdomen: Soft, gravid, appropriate for gestational age.  Pain/Pressure: Absent     Pelvic: Cervical exam deferred        Extremities: Normal range of motion.  Edema: Trace  Mental Status:  Normal mood and affect. Normal behavior. Normal judgment and thought content.   Assessment and Plan:  Pregnancy: G2P0010 at [redacted]w[redacted]d  1. Supervision of high risk pregnancy, antepartum - Flu Vaccine QUAD 36+ mos IM  2. GDMA1- fasting BG around 90 at home and 2 hour BG around 110. Repeat hgba1c today.  Preterm labor symptoms and general obstetric precautions including but not limited to vaginal bleeding, contractions, leaking of fluid and fetal movement were reviewed in detail with the patient. Please refer to After Visit Summary for other counseling recommendations.  Return in about 2 weeks  (around 01/15/2017).   Rolm Bookbinder, DO

## 2017-01-01 NOTE — Patient Instructions (Signed)

## 2017-01-02 LAB — HEMOGLOBIN A1C
Est. average glucose Bld gHb Est-mCnc: 114 mg/dL
HEMOGLOBIN A1C: 5.6 % (ref 4.8–5.6)

## 2017-01-15 ENCOUNTER — Ambulatory Visit (INDEPENDENT_AMBULATORY_CARE_PROVIDER_SITE_OTHER): Payer: Self-pay | Admitting: Advanced Practice Midwife

## 2017-01-15 VITALS — BP 102/49 | HR 77 | Wt 193.6 lb

## 2017-01-15 DIAGNOSIS — O24410 Gestational diabetes mellitus in pregnancy, diet controlled: Secondary | ICD-10-CM

## 2017-01-15 DIAGNOSIS — Z3A38 38 weeks gestation of pregnancy: Secondary | ICD-10-CM

## 2017-01-15 LAB — POCT URINALYSIS DIP (DEVICE)
BILIRUBIN URINE: NEGATIVE
Glucose, UA: NEGATIVE mg/dL
Hgb urine dipstick: NEGATIVE
Ketones, ur: NEGATIVE mg/dL
NITRITE: NEGATIVE
PH: 6.5 (ref 5.0–8.0)
PROTEIN: NEGATIVE mg/dL
Specific Gravity, Urine: 1.025 (ref 1.005–1.030)
UROBILINOGEN UA: 0.2 mg/dL (ref 0.0–1.0)

## 2017-01-15 NOTE — Progress Notes (Signed)
Spanish interpreter Lorinda Creed present for visit

## 2017-01-15 NOTE — Patient Instructions (Signed)
Contracciones de Braxton Hicks °(Braxton Hicks Contractions) °Durante el embarazo, pueden presentarse contracciones uterinas que no siempre indican que está en trabajo de parto. °¿QUÉ SON LAS CONTRACCIONES DE BRAXTON HICKS? °Las contracciones que se presentan antes del trabajo de parto se conocen como contracciones de Braxton Hicks o falso trabajo de parto. Hacia el final del embarazo (32 a 34 semanas), estas contracciones pueden aparecen con más frecuencia y volverse más intensas. No corresponden al trabajo de parto verdadero porque estas contracciones no producen el agrandamiento (la dilatación) y el afinamiento del cuello del útero. Algunas veces, es difícil distinguirlas del trabajo de parto verdadero porque en algunos casos pueden ser muy intensas, y las personas tienen diferentes niveles de tolerancia al dolor. No debe sentirse avergonzada si concurre al hospital con falso trabajo de parto. En ocasiones, la única forma de saber si el trabajo de parto es verdadero es que el médico determine si hay cambios en el cuello del útero. °Si no hay problemas prenatales u otras complicaciones de salud asociadas con el embarazo, no habrá inconvenientes si la envían a su casa con falso trabajo de parto y espera que comience el verdadero. °CÓMO DIFERENCIAR EL TRABAJO DE PARTO FALSO DEL VERDADERO °Falso trabajo de parto  °· Las contracciones del falso trabajo de parto duran menos y no son tan intensas como las verdaderas. °· Generalmente son irregulares. °· A menudo, se sienten en la parte delantera de la parte baja del abdomen y en la ingle, °· y pueden desaparecer cuando camina o cambia de posición mientras está acostada. °· Las contracciones se vuelven más débiles y su duración es menor a medida que el tiempo transcurre. °· Por lo general, no se hacen progresivamente más intensas, regulares y cercanas entre sí como en el caso del trabajo de parto verdadero. °Verdadero trabajo de parto  °· Las contracciones del verdadero  trabajo de parto duran de 30 a 70 segundos, son muy regulares y suelen volverse más intensas, y aumenta su frecuencia. °· No desaparecen cuando camina. °· La molestia generalmente se siente en la parte superior del útero y se extiende hacia la zona inferior del abdomen y hacia la cintura. °· El médico podrá examinarla para determinar si el trabajo de parto es verdadero. El examen mostrará si el cuello del útero se está dilatando y afinando. °LO QUE DEBE RECORDAR °· Continúe haciendo los ejercicios habituales y siga otras indicaciones que el médico le dé. °· Tome todos los medicamentos como le indicó el médico. °· Concurra a las visitas prenatales regulares. °· Coma y beba con moderación si cree que está en trabajo de parto. °· Si las contracciones de Braxton Hicks le provocan incomodidad: °¨ Cambie de posición: si está acostada o descansando, camine; si está caminando, descanse. °¨ Siéntese y descanse en una bañera con agua tibia. °¨ Beba 2 o 3 vasos de agua. La deshidratación puede provocar contracciones. °¨ Respire lenta y profundamente varias veces por hora. °¿CUÁNDO DEBO BUSCAR ASISTENCIA MÉDICA INMEDIATA? °Solicite atención médica de inmediato si: °· Las contracciones se intensifican, se hacen más regulares y cercanas entre sí. °· Tiene una pérdida de líquido por la vagina. °· Tiene fiebre. °· Elimina mucosidad manchada con sangre. °· Tiene una hemorragia vaginal abundante. °· Tiene dolor abdominal permanente. °· Tiene un dolor en la zona lumbar que nunca tuvo antes. °· Siente que la cabeza del bebé empuja hacia abajo y ejerce presión en la zona pélvica. °· El bebé no se mueve tanto como solía. °Esta información no tiene como fin reemplazar el   consejo del médico. Asegúrese de hacerle al médico cualquier pregunta que tenga. °Document Released: 01/02/2005 Document Revised: 07/17/2015 Document Reviewed: 01/04/2013 °Elsevier Interactive Patient Education © 2017 Elsevier Inc. ° °

## 2017-01-15 NOTE — Progress Notes (Signed)
   PRENATAL VISIT NOTE  Subjective:  Aimee Lopez is a 21 y.o. G2P0010 at [redacted]w[redacted]d being seen today for ongoing prenatal care.  She is currently monitored for the following issues for this high-risk pregnancy and has Gestational diabetes mellitus and Supervision of high risk pregnancy, antepartum on her problem list.  Patient reports no complaints.  Contractions: Not present. Vag. Bleeding: None.  Movement: Present. Denies leaking of fluid.   The following portions of the patient's history were reviewed and updated as appropriate: allergies, current medications, past family history, past medical history, past social history, past surgical history and problem list. Problem list updated.  Objective:   Vitals:   01/15/17 1127  BP: (!) 102/49  Pulse: 77  Weight: 193 lb 9.6 oz (87.8 kg)    Fetal Status: Fetal Heart Rate (bpm): 148 Fundal Height: 34 cm Movement: Present  Presentation: Vertex  General:  Alert, oriented and cooperative. Patient is in no acute distress.  Skin: Skin is warm and dry. No rash noted.   Cardiovascular: Normal heart rate noted  Respiratory: Normal respiratory effort, no problems with respiration noted  Abdomen: Soft, gravid, appropriate for gestational age.  Pain/Pressure: Absent     Pelvic: Cervical exam deferred        Extremities: Normal range of motion.  Edema: Trace  Mental Status:  Normal mood and affect. Normal behavior. Normal judgment and thought content.   All CBGs in range  Assessment and Plan:  Pregnancy: G2P0010 at [redacted]w[redacted]d  1. GDM, class A1  - [place] [facility] OB FOLLOW UP; Future  2. [redacted] weeks gestation of pregnancy  - [place] [facility] OB FOLLOW UP; Future  Preterm labor symptoms and general obstetric precautions including but not limited to vaginal bleeding, contractions, leaking of fluid and fetal movement were reviewed in detail with the patient. Please refer to After Visit Summary for other counseling recommendations.  F/U 2 weeks   Dorathy Kinsman,  [place]

## 2017-01-29 ENCOUNTER — Ambulatory Visit (INDEPENDENT_AMBULATORY_CARE_PROVIDER_SITE_OTHER): Payer: Self-pay | Admitting: Advanced Practice Midwife

## 2017-01-29 VITALS — BP 108/74 | HR 80 | Wt 198.5 lb

## 2017-01-29 DIAGNOSIS — O24410 Gestational diabetes mellitus in pregnancy, diet controlled: Secondary | ICD-10-CM

## 2017-01-29 DIAGNOSIS — R8290 Unspecified abnormal findings in urine: Secondary | ICD-10-CM

## 2017-01-29 DIAGNOSIS — Z113 Encounter for screening for infections with a predominantly sexual mode of transmission: Secondary | ICD-10-CM

## 2017-01-29 DIAGNOSIS — O0993 Supervision of high risk pregnancy, unspecified, third trimester: Secondary | ICD-10-CM

## 2017-01-29 DIAGNOSIS — O0990 Supervision of high risk pregnancy, unspecified, unspecified trimester: Secondary | ICD-10-CM

## 2017-01-29 LAB — POCT URINALYSIS DIP (DEVICE)
Bilirubin Urine: NEGATIVE
Glucose, UA: 100 mg/dL — AB
Ketones, ur: NEGATIVE mg/dL
NITRITE: NEGATIVE
PROTEIN: NEGATIVE mg/dL
SPECIFIC GRAVITY, URINE: 1.015 (ref 1.005–1.030)
UROBILINOGEN UA: 0.2 mg/dL (ref 0.0–1.0)
pH: 7 (ref 5.0–8.0)

## 2017-01-29 NOTE — Patient Instructions (Signed)
Contracciones de Braxton Hicks °(Braxton Hicks Contractions) °Durante el embarazo, pueden presentarse contracciones uterinas que no siempre indican que está en trabajo de parto. °¿QUÉ SON LAS CONTRACCIONES DE BRAXTON HICKS? °Las contracciones que se presentan antes del trabajo de parto se conocen como contracciones de Braxton Hicks o falso trabajo de parto. Hacia el final del embarazo (32 a 34 semanas), estas contracciones pueden aparecen con más frecuencia y volverse más intensas. No corresponden al trabajo de parto verdadero porque estas contracciones no producen el agrandamiento (la dilatación) y el afinamiento del cuello del útero. Algunas veces, es difícil distinguirlas del trabajo de parto verdadero porque en algunos casos pueden ser muy intensas, y las personas tienen diferentes niveles de tolerancia al dolor. No debe sentirse avergonzada si concurre al hospital con falso trabajo de parto. En ocasiones, la única forma de saber si el trabajo de parto es verdadero es que el médico determine si hay cambios en el cuello del útero. °Si no hay problemas prenatales u otras complicaciones de salud asociadas con el embarazo, no habrá inconvenientes si la envían a su casa con falso trabajo de parto y espera que comience el verdadero. °CÓMO DIFERENCIAR EL TRABAJO DE PARTO FALSO DEL VERDADERO °Falso trabajo de parto  °· Las contracciones del falso trabajo de parto duran menos y no son tan intensas como las verdaderas. °· Generalmente son irregulares. °· A menudo, se sienten en la parte delantera de la parte baja del abdomen y en la ingle, °· y pueden desaparecer cuando camina o cambia de posición mientras está acostada. °· Las contracciones se vuelven más débiles y su duración es menor a medida que el tiempo transcurre. °· Por lo general, no se hacen progresivamente más intensas, regulares y cercanas entre sí como en el caso del trabajo de parto verdadero. °Verdadero trabajo de parto  °· Las contracciones del verdadero  trabajo de parto duran de 30 a 70 segundos, son muy regulares y suelen volverse más intensas, y aumenta su frecuencia. °· No desaparecen cuando camina. °· La molestia generalmente se siente en la parte superior del útero y se extiende hacia la zona inferior del abdomen y hacia la cintura. °· El médico podrá examinarla para determinar si el trabajo de parto es verdadero. El examen mostrará si el cuello del útero se está dilatando y afinando. °LO QUE DEBE RECORDAR °· Continúe haciendo los ejercicios habituales y siga otras indicaciones que el médico le dé. °· Tome todos los medicamentos como le indicó el médico. °· Concurra a las visitas prenatales regulares. °· Coma y beba con moderación si cree que está en trabajo de parto. °· Si las contracciones de Braxton Hicks le provocan incomodidad: °¨ Cambie de posición: si está acostada o descansando, camine; si está caminando, descanse. °¨ Siéntese y descanse en una bañera con agua tibia. °¨ Beba 2 o 3 vasos de agua. La deshidratación puede provocar contracciones. °¨ Respire lenta y profundamente varias veces por hora. °¿CUÁNDO DEBO BUSCAR ASISTENCIA MÉDICA INMEDIATA? °Solicite atención médica de inmediato si: °· Las contracciones se intensifican, se hacen más regulares y cercanas entre sí. °· Tiene una pérdida de líquido por la vagina. °· Tiene fiebre. °· Elimina mucosidad manchada con sangre. °· Tiene una hemorragia vaginal abundante. °· Tiene dolor abdominal permanente. °· Tiene un dolor en la zona lumbar que nunca tuvo antes. °· Siente que la cabeza del bebé empuja hacia abajo y ejerce presión en la zona pélvica. °· El bebé no se mueve tanto como solía. °Esta información no tiene como fin reemplazar el   consejo del médico. Asegúrese de hacerle al médico cualquier pregunta que tenga. °Document Released: 01/02/2005 Document Revised: 07/17/2015 Document Reviewed: 01/04/2013 °Elsevier Interactive Patient Education © 2017 Elsevier Inc. ° °

## 2017-01-29 NOTE — Progress Notes (Signed)
Spanish video interpreter [phone] used for visit

## 2017-01-29 NOTE — Progress Notes (Signed)
Morning Fasting Blood Sugars: 90, 93, 94  Post-prandial Blood Sugars: Breakfast-   N/A Lunch- 115 Dinner- 108, 110    PRENATAL VISIT NOTE  Subjective:  Aimee Lopez is a 21 y.o. G2P0010 at 4530w3d being seen today for ongoing prenatal care.  She is currently monitored for the following issues for this high-risk pregnancy and has Gestational diabetes mellitus and Supervision of high risk pregnancy, antepartum on her problem list.  Patient reports occasional contractions.  Contractions: Irregular. Vag. Bleeding: None.  Movement: Present. Denies leaking of fluid.   The following portions of the patient's history were reviewed and updated as appropriate: allergies, current medications, past family history, past medical history, past social history, past surgical history and problem list. Problem list updated.  Objective:   Vitals:   01/29/17 1029  BP: 108/74  Pulse: 80  Weight: 198 lb 8 oz (90 kg)    Fetal Status: Fetal Heart Rate (bpm): 140   Movement: Present     General:  Alert, oriented and cooperative. Patient is in no acute distress.  Skin: Skin is warm and dry. No rash noted.   Cardiovascular: Normal heart rate noted  Respiratory: Normal respiratory effort, no problems with respiration noted  Abdomen: Soft, gravid, appropriate for gestational age.  Pain/Pressure: Present     Pelvic: Cervical exam performed      FT/long/Ballotable, Vtx, soft  Extremities: Normal range of motion.  Edema: Trace  Mental Status:  Normal mood and affect. Normal behavior. Normal judgment and thought content.   Assessment and Plan:  Pregnancy: G2P0010 at 5330w3d  1. Supervision of high risk pregnancy, antepartum  - Strep Gp B NAA - Cervicovaginal ancillary only  2. Abnormal urinalysis--Sx urinary complaints  - Culture, OB Urine  3. Diet controlled gestational diabetes mellitus (GDM) in third trimester - Continue current POC - Strongly encouraged to document and bring CBGs - Shown APP  to document blood sugar - Plan Growth US 38 weeks  Term labor symptoms and general obstetric precautions including but not limited to vaginal bleeding, contractions, leaking of fluid and fetal movement were reviewed in detail with the patient. Please refer to After Visit Summary for other counseling recommendations.  Return in about 1 week (around 02/05/2017) for ROB.   Aimee Lopez, CNM

## 2017-01-30 LAB — CERVICOVAGINAL ANCILLARY ONLY
Chlamydia: NEGATIVE
Neisseria Gonorrhea: NEGATIVE

## 2017-01-31 LAB — STREP GP B NAA: STREP GROUP B AG: NEGATIVE

## 2017-01-31 LAB — URINE CULTURE, OB REFLEX

## 2017-01-31 LAB — CULTURE, OB URINE

## 2017-02-04 ENCOUNTER — Inpatient Hospital Stay (HOSPITAL_COMMUNITY)
Admission: AD | Admit: 2017-02-04 | Discharge: 2017-02-04 | Disposition: A | Discharge status: Home / Self Care | Payer: Medicaid Other | Source: Ambulatory Visit | Attending: Obstetrics and Gynecology | Admitting: Obstetrics and Gynecology

## 2017-02-04 ENCOUNTER — Encounter (HOSPITAL_COMMUNITY): Payer: Self-pay | Admitting: *Deleted

## 2017-02-04 DIAGNOSIS — O4690 Antepartum hemorrhage, unspecified, unspecified trimester: Secondary | ICD-10-CM

## 2017-02-04 DIAGNOSIS — O4693 Antepartum hemorrhage, unspecified, third trimester: Secondary | ICD-10-CM | POA: Diagnosis not present

## 2017-02-04 DIAGNOSIS — Z833 Family history of diabetes mellitus: Secondary | ICD-10-CM | POA: Diagnosis not present

## 2017-02-04 DIAGNOSIS — N760 Acute vaginitis: Secondary | ICD-10-CM | POA: Diagnosis not present

## 2017-02-04 DIAGNOSIS — B9689 Other specified bacterial agents as the cause of diseases classified elsewhere: Secondary | ICD-10-CM | POA: Insufficient documentation

## 2017-02-04 DIAGNOSIS — Z87891 Personal history of nicotine dependence: Secondary | ICD-10-CM | POA: Insufficient documentation

## 2017-02-04 DIAGNOSIS — Z3A37 37 weeks gestation of pregnancy: Secondary | ICD-10-CM | POA: Insufficient documentation

## 2017-02-04 DIAGNOSIS — R319 Hematuria, unspecified: Secondary | ICD-10-CM | POA: Diagnosis present

## 2017-02-04 DIAGNOSIS — O23593 Infection of other part of genital tract in pregnancy, third trimester: Secondary | ICD-10-CM | POA: Diagnosis not present

## 2017-02-04 LAB — WET PREP, GENITAL
SPERM: NONE SEEN
Trich, Wet Prep: NONE SEEN
Yeast Wet Prep HPF POC: NONE SEEN

## 2017-02-04 LAB — URINALYSIS, ROUTINE W REFLEX MICROSCOPIC
Bacteria, UA: NONE SEEN
Bilirubin Urine: NEGATIVE
GLUCOSE, UA: NEGATIVE mg/dL
Ketones, ur: NEGATIVE mg/dL
NITRITE: NEGATIVE
PH: 6 (ref 5.0–8.0)
Protein, ur: NEGATIVE mg/dL
Specific Gravity, Urine: 1.02 (ref 1.005–1.030)

## 2017-02-04 MED ORDER — METRONIDAZOLE 500 MG PO TABS: 500.0000 mg | ORAL_TABLET | Freq: Two times a day (BID) | ORAL | 0 refills | Status: DC

## 2017-02-04 NOTE — MAU Provider Note (Signed)
History     CSN: [id number]  Arrival date and time: 02/04/17 1605   First Provider Initiated Contact with Patient 02/04/17 1706      Chief Complaint  Patient presents with  . bleeding with urination   HPI Aimee Lopez is a 21 y.o. G2P0010 at 6128w1d who presents with vaginal bleeding. She reports seeing blood in the toilet when she urinates, but none on the tissue. She does not have to wear a pad. She denies any other vaginal discharge or leaking of fluid. She reports good fetal movement. She denies any intercourse since becoming pregnant. She denies any pain or contractions.   OB History    Gravida Para Term Preterm AB Living   2 0 0 0 1 0   SAB TAB Ectopic Multiple Live Births   1 0 0 0 0      Past Medical History:  Diagnosis Date  . Medical history non-contributory     Past Surgical History:  Procedure Laterality Date  . NO PAST SURGERIES      Family History  Problem Relation Age of Onset  . Diabetes Mother   . Diabetes Father   . Diabetes Maternal Aunt     Social History  Substance Use Topics  . Smoking status: Former Smoker    Quit date: 06/17/2016  . Smokeless tobacco: Never Used  . Alcohol use No    Allergies: No Known Allergies  Prescriptions Prior to Admission  Medication Sig Dispense Refill Last Dose  . ACCU-CHEK FASTCLIX LANCETS MISC 1 Device by Percutaneous route 4 (four) times daily. 100 each 12 02/04/2017 at Unknown time  . glucose blood (ACCU-CHEK GUIDE) test strip Use as instructed 100 each 12 02/04/2017 at Unknown time  . Prenatal Vit-Fe Fumarate-FA (PREPLUS) 27-1 MG TABS Take 1 tablet by mouth daily. 30 tablet 13 02/04/2017 at Unknown time  . sodium chloride (OCEAN) 0.65 % SOLN nasal spray Place 1 spray into both nostrils as needed for congestion. (Patient not taking: Reported on 01/01/2017) 1 Bottle 0 Not Taking    Review of Systems  Constitutional: Negative.  Negative for fatigue and fever.  HENT: Negative.   Respiratory: Negative.   Negative for shortness of breath.   Cardiovascular: Negative.  Negative for chest pain.  Gastrointestinal: Negative.  Negative for abdominal pain, constipation, diarrhea, nausea and vomiting.  Genitourinary: Positive for vaginal bleeding. Negative for dysuria and vaginal discharge.  Neurological: Negative.  Negative for dizziness and headaches.   Physical Exam   Blood pressure 128/67, pulse 81, temperature 98.1 F (36.7 C), temperature source Oral, resp. rate 17, weight 201 lb 0.6 oz (91.2 kg), last menstrual period 05/20/2016, SpO2 99 %, unknown if currently breastfeeding.  Physical Exam  Nursing note and vitals reviewed. Constitutional: She is oriented to person, place, and time. She appears well-developed and well-nourished. No distress.  HENT:  Head: Normocephalic.  Eyes: Pupils are equal, round, and reactive to light.  Cardiovascular: Normal rate, regular rhythm and normal heart sounds.   Respiratory: Effort normal and breath sounds normal. No respiratory distress.  GI: Soft. Bowel sounds are normal. She exhibits no distension. There is no tenderness.  Genitourinary: Cervix exhibits friability. Cervix exhibits no motion tenderness. Vaginal discharge (small amount of malodorus frothy yellow discharge) found.  Neurological: She is alert and oriented to person, place, and time.  Skin: Skin is warm and dry.  Psychiatric: She has a normal mood and affect. Her behavior is normal. Judgment and thought content normal.    Fetal  Tracing:  Baseline: 135 bpm Variability: moderate Accels: 15x15 Decels: none  Toco: occasional uc's  Dilation: Closed Effacement (%): Thick Station: Ballotable Presentation: Undeterminable Exam by:: Ma Hillock CNM MAU Course  Procedures Results for orders placed or performed during the hospital encounter of 02/04/17 (from the past 24 hour(s))  Urinalysis, Routine w reflex microscopic     Status: Abnormal   Collection Time: 02/04/17  4:35 PM  Result  Value Ref Range   Color, Urine YELLOW YELLOW   APPearance CLEAR CLEAR   Specific Gravity, Urine 1.020 1.005 - 1.030   pH 6.0 5.0 - 8.0   Glucose, UA NEGATIVE NEGATIVE mg/dL   Hgb urine dipstick MODERATE (A) NEGATIVE   Bilirubin Urine NEGATIVE NEGATIVE   Ketones, ur NEGATIVE NEGATIVE mg/dL   Protein, ur NEGATIVE NEGATIVE mg/dL   Nitrite NEGATIVE NEGATIVE   Leukocytes, UA LARGE (A) NEGATIVE   RBC / HPF 0-5 0 - 5 RBC/hpf   WBC, UA 0-5 0 - 5 WBC/hpf   Bacteria, UA NONE SEEN NONE SEEN   Squamous Epithelial / LPF 6-30 (A) NONE SEEN   Mucus PRESENT   Wet prep, genital     Status: Abnormal   Collection Time: 02/04/17  5:26 PM  Result Value Ref Range   Yeast Wet Prep HPF POC NONE SEEN NONE SEEN   Trich, Wet Prep NONE SEEN NONE SEEN   Clue Cells Wet Prep HPF POC PRESENT (A) NONE SEEN   WBC, Wet Prep HPF POC MODERATE (A) NONE SEEN   Sperm NONE SEEN    MDM UA Wet prep and gc/chlamydia NST  Assessment and Plan   1. Bacterial vaginosis   2. Vaginal bleeding in pregnancy   3. [redacted] weeks gestation of pregnancy    -Discharge home in stable condition -Rx for metronidazole sent to patient's pharmacy -Labor and bleeding precautions discussed -Patient advised to [facility]up with [facility] as scheduled for prenatal care -Patient may return to MAU as needed or if her condition were to change or worsen  Rolm Bookbinder CNM 02/04/2017, 5:58 PM

## 2017-02-04 NOTE — MAU Note (Signed)
+  bleeding States when she urinates Since last night  Denies pain at this time.   +FM

## 2017-02-04 NOTE — MAU Note (Signed)
Pt requested interpreteur for all further questions and preceedures.

## 2017-02-04 NOTE — Discharge Instructions (Signed)
Vaginosis bacteriana (Bacterial Vaginosis) La vaginosis bacteriana es una infeccin de la vagina. Se produce cuando crece una cantidad excesiva de grmenes normales (bacterias sanas) en la vagina. Esta infeccin aumenta el riesgo de contraer otras infecciones de transmisin sexual. El tratamiento de esta infeccin puede ayudar a reducir el riesgo de otras infecciones, como:  Clamidia.  Gonorrea.  VIH.  Herpes. CUIDADOS EN EL HOGAR  Tome los medicamentos tal como se lo indic su mdico.  Finalice la prescripcin completa, aunque comience a sentirse mejor.  Comunique a sus compaeros sexuales que sufre una infeccin. Deben consultar a su mdico para iniciar un tratamiento.  Durante el tratamiento: ? Evite mantener relaciones sexuales o use preservativos de la forma correcta. ? No se haga duchas vaginales. ? No consuma alcohol a menos que el mdico lo autorice. ? No amamante a menos que el mdico la autorice.  SOLICITE AYUDA SI:  No mejora luego de 3 das de tratamiento.  Observa una secrecin (prdida) de color gris ms abundante que proviene de la vagina.  Siente ms dolor que antes.  Tiene fiebre.  ASEGRESE DE QUE:  Comprende estas instrucciones.  Controlar su afeccin.  Recibir ayuda de inmediato si no mejora o si empeora.  Esta informacin no tiene como fin reemplazar el consejo del mdico. Asegrese de hacerle al mdico cualquier pregunta que tenga. Document Released: 06/21/2008 Document Revised: 07/17/2015 Document Reviewed: 11/04/2012 Elsevier Interactive Patient Education  2017 Elsevier Inc.  

## 2017-02-04 NOTE — MAU Note (Signed)
Pt states to interpreter she does not need interpreter currently. Will let RN know if she needs interpeter.

## 2017-02-05 ENCOUNTER — Ambulatory Visit (INDEPENDENT_AMBULATORY_CARE_PROVIDER_SITE_OTHER): Payer: Self-pay | Admitting: Advanced Practice Midwife

## 2017-02-05 VITALS — BP 115/69 | HR 72 | Wt 199.2 lb

## 2017-02-05 DIAGNOSIS — O24410 Gestational diabetes mellitus in pregnancy, diet controlled: Secondary | ICD-10-CM

## 2017-02-05 DIAGNOSIS — O0990 Supervision of high risk pregnancy, unspecified, unspecified trimester: Secondary | ICD-10-CM

## 2017-02-05 LAB — POCT URINALYSIS DIP (DEVICE)
Bilirubin Urine: NEGATIVE
Glucose, UA: NEGATIVE mg/dL
KETONES UR: NEGATIVE mg/dL
Nitrite: NEGATIVE
PH: 7 (ref 5.0–8.0)
PROTEIN: NEGATIVE mg/dL
SPECIFIC GRAVITY, URINE: 1.02 (ref 1.005–1.030)
Urobilinogen, UA: 1 mg/dL (ref 0.0–1.0)

## 2017-02-05 NOTE — Patient Instructions (Signed)
Contracciones de Braxton Hicks °(Braxton Hicks Contractions) °Durante el embarazo, pueden presentarse contracciones uterinas que no siempre indican que está en trabajo de parto. °¿QUÉ SON LAS CONTRACCIONES DE BRAXTON HICKS? °Las contracciones que se presentan antes del trabajo de parto se conocen como contracciones de Braxton Hicks o falso trabajo de parto. Hacia el final del embarazo (32 a 34 semanas), estas contracciones pueden aparecen con más frecuencia y volverse más intensas. No corresponden al trabajo de parto verdadero porque estas contracciones no producen el agrandamiento (la dilatación) y el afinamiento del cuello del útero. Algunas veces, es difícil distinguirlas del trabajo de parto verdadero porque en algunos casos pueden ser muy intensas, y las personas tienen diferentes niveles de tolerancia al dolor. No debe sentirse avergonzada si concurre al hospital con falso trabajo de parto. En ocasiones, la única forma de saber si el trabajo de parto es verdadero es que el médico determine si hay cambios en el cuello del útero. °Si no hay problemas prenatales u otras complicaciones de salud asociadas con el embarazo, no habrá inconvenientes si la envían a su casa con falso trabajo de parto y espera que comience el verdadero. °CÓMO DIFERENCIAR EL TRABAJO DE PARTO FALSO DEL VERDADERO °Falso trabajo de parto  °· Las contracciones del falso trabajo de parto duran menos y no son tan intensas como las verdaderas. °· Generalmente son irregulares. °· A menudo, se sienten en la parte delantera de la parte baja del abdomen y en la ingle, °· y pueden desaparecer cuando camina o cambia de posición mientras está acostada. °· Las contracciones se vuelven más débiles y su duración es menor a medida que el tiempo transcurre. °· Por lo general, no se hacen progresivamente más intensas, regulares y cercanas entre sí como en el caso del trabajo de parto verdadero. °Verdadero trabajo de parto  °· Las contracciones del verdadero  trabajo de parto duran de 30 a 70 segundos, son muy regulares y suelen volverse más intensas, y aumenta su frecuencia. °· No desaparecen cuando camina. °· La molestia generalmente se siente en la parte superior del útero y se extiende hacia la zona inferior del abdomen y hacia la cintura. °· El médico podrá examinarla para determinar si el trabajo de parto es verdadero. El examen mostrará si el cuello del útero se está dilatando y afinando. °LO QUE DEBE RECORDAR °· Continúe haciendo los ejercicios habituales y siga otras indicaciones que el médico le dé. °· Tome todos los medicamentos como le indicó el médico. °· Concurra a las visitas prenatales regulares. °· Coma y beba con moderación si cree que está en trabajo de parto. °· Si las contracciones de Braxton Hicks le provocan incomodidad: °¨ Cambie de posición: si está acostada o descansando, camine; si está caminando, descanse. °¨ Siéntese y descanse en una bañera con agua tibia. °¨ Beba 2 o 3 vasos de agua. La deshidratación puede provocar contracciones. °¨ Respire lenta y profundamente varias veces por hora. °¿CUÁNDO DEBO BUSCAR ASISTENCIA MÉDICA INMEDIATA? °Solicite atención médica de inmediato si: °· Las contracciones se intensifican, se hacen más regulares y cercanas entre sí. °· Tiene una pérdida de líquido por la vagina. °· Tiene fiebre. °· Elimina mucosidad manchada con sangre. °· Tiene una hemorragia vaginal abundante. °· Tiene dolor abdominal permanente. °· Tiene un dolor en la zona lumbar que nunca tuvo antes. °· Siente que la cabeza del bebé empuja hacia abajo y ejerce presión en la zona pélvica. °· El bebé no se mueve tanto como solía. °Esta información no tiene como fin reemplazar el   consejo del médico. Asegúrese de hacerle al médico cualquier pregunta que tenga. °Document Released: 01/02/2005 Document Revised: 07/17/2015 Document Reviewed: 01/04/2013 °Elsevier Interactive Patient Education © 2017 Elsevier Inc. ° °

## 2017-02-05 NOTE — Progress Notes (Signed)
   PRENATAL VISIT NOTE  Subjective:  Aimee Lopez is a 21 y.o. G2P0010 at 5323w2d being seen today for ongoing prenatal care.  She is currently monitored for the following issues for this high-risk pregnancy and has Gestational diabetes mellitus and Supervision of high risk pregnancy, antepartum on her problem list.  Patient reports no complaints.  Contractions: Not present. Vag. Bleeding: None.  Movement: Present. Denies leaking of fluid.   The following portions of the patient's history were reviewed and updated as appropriate: allergies, current medications, past family history, past medical history, past social history, past surgical history and problem list. Problem list updated.  Objective:   Vitals:   02/05/17 1134  BP: 115/69  Pulse: 72  Weight: 199 lb 3.2 oz (90.4 kg)    Fetal Status: Fetal Heart Rate (bpm): 138   Movement: Present     General:  Alert, oriented and cooperative. Patient is in no acute distress.  Skin: Skin is warm and dry. No rash noted.   Cardiovascular: Normal heart rate noted  Respiratory: Normal respiratory effort, no problems with respiration noted  Abdomen: Soft, gravid, appropriate for gestational age.  Pain/Pressure: Present     Pelvic: Cervical exam deferred        Extremities: Normal range of motion.  Edema: Trace  Mental Status:  Normal mood and affect. Normal behavior. Normal judgment and thought content.   All CBGs in range  Assessment and Plan:  Pregnancy: G2P0010 at 6623w2d  1. Diet controlled gestational diabetes mellitus (GDM) in third trimester - Continue diet  - Growth US 1 week - Plan IOL at 40 weeks  2. Supervision of high risk pregnancy, antepartum   Term labor symptoms and general obstetric precautions including but not limited to vaginal bleeding, contractions, leaking of fluid and fetal movement were reviewed in detail with the patient. Please refer to After Visit Summary for other counseling recommendations.  Return in  about 1 week (around 02/12/2017) for ROB.   Dorathy KinsmanVirginia Troi Florendo, CNM

## 2017-02-06 LAB — GC/CHLAMYDIA PROBE AMP (~~LOC~~) NOT AT ARMC
Chlamydia: NEGATIVE
Neisseria Gonorrhea: NEGATIVE

## 2017-02-12 ENCOUNTER — Encounter: Payer: Self-pay | Admitting: Obstetrics and Gynecology

## 2017-02-12 ENCOUNTER — Ambulatory Visit (HOSPITAL_COMMUNITY)
Admission: RE | Admit: 2017-02-12 | Discharge: 2017-02-12 | Disposition: A | Discharge status: Home / Self Care | Payer: Medicaid Other | Source: Ambulatory Visit | Attending: Advanced Practice Midwife | Admitting: Advanced Practice Midwife

## 2017-02-12 ENCOUNTER — Ambulatory Visit (INDEPENDENT_AMBULATORY_CARE_PROVIDER_SITE_OTHER): Payer: Self-pay | Admitting: Obstetrics and Gynecology

## 2017-02-12 VITALS — BP 119/76 | HR 79 | Wt 199.8 lb

## 2017-02-12 DIAGNOSIS — O24410 Gestational diabetes mellitus in pregnancy, diet controlled: Secondary | ICD-10-CM | POA: Insufficient documentation

## 2017-02-12 DIAGNOSIS — Z3A38 38 weeks gestation of pregnancy: Secondary | ICD-10-CM | POA: Diagnosis not present

## 2017-02-12 DIAGNOSIS — O99213 Obesity complicating pregnancy, third trimester: Secondary | ICD-10-CM | POA: Diagnosis not present

## 2017-02-12 DIAGNOSIS — O0990 Supervision of high risk pregnancy, unspecified, unspecified trimester: Secondary | ICD-10-CM

## 2017-02-12 NOTE — Progress Notes (Signed)
   PRENATAL VISIT NOTE  Subjective:  Aimee Lopez is a 21 y.o. G2P0010 at 5756w2d being seen today for ongoing prenatal care.  She is currently monitored for the following issues for this high-risk pregnancy and has Gestational diabetes mellitus and Supervision of high risk pregnancy, antepartum on their problem list.  Patient reports no complaints.  Contractions: Not present. Vag. Bleeding: Bloody Show.  Movement: Present. Denies leaking of fluid.   The following portions of the patient's history were reviewed and updated as appropriate: allergies, current medications, past family history, past medical history, past social history, past surgical history and problem list. Problem list updated.  Objective:   Vitals:   02/12/17 1429  BP: 119/76  Pulse: 79  Weight: 199 lb 12.8 oz (90.6 kg)    Fetal Status: Fetal Heart Rate (bpm): 155 Fundal Height: 38 cm Movement: Present     General:  Alert, oriented and cooperative. Patient is in no acute distress.  Skin: Skin is warm and dry. No rash noted.   Cardiovascular: Normal heart rate noted  Respiratory: Normal respiratory effort, no problems with respiration noted  Abdomen: Soft, gravid, appropriate for gestational age.  Pain/Pressure: Present     Pelvic: Cervical exam deferred        Extremities: Normal range of motion.  Edema: Trace  Mental Status:  Normal mood and affect. Normal behavior. Normal judgment and thought content.   Assessment and Plan:  Pregnancy: G2P0010 at 7056w2d  1. Supervision of high risk pregnancy, antepartum Patient is doing well without complaints  2. Diet controlled gestational diabetes mellitus (GDM) in third trimester Patient did not bring CBG log but reports fasting as high as 93 and pp as high as 116 Patient with growth ultrasound today. Report not yet available for review but patient reports EFW 7lb 7oz Patient will be scheduled for IOL at 40 weeks  Term labor symptoms and general obstetric precautions  including but not limited to vaginal bleeding, contractions, leaking of fluid and fetal movement were reviewed in detail with the patient. Please refer to After Visit Summary for other counseling recommendations.  Return in about 1 week (around 02/19/2017) for ROB.   Catalina AntiguaPeggy Verity Gilcrest, MD

## 2017-02-12 NOTE — Progress Notes (Signed)
Video Interpreter # [phone]

## 2017-02-16 ENCOUNTER — Inpatient Hospital Stay (HOSPITAL_COMMUNITY)
Admission: AD | Admit: 2017-02-16 | Discharge: 2017-02-17 | Disposition: A | Discharge status: Home / Self Care | Payer: Medicaid Other | Source: Ambulatory Visit | Attending: Obstetrics & Gynecology | Admitting: Obstetrics & Gynecology

## 2017-02-16 ENCOUNTER — Encounter (HOSPITAL_COMMUNITY): Payer: Self-pay | Admitting: *Deleted

## 2017-02-16 DIAGNOSIS — Z87891 Personal history of nicotine dependence: Secondary | ICD-10-CM | POA: Insufficient documentation

## 2017-02-16 DIAGNOSIS — Z3A38 38 weeks gestation of pregnancy: Secondary | ICD-10-CM | POA: Insufficient documentation

## 2017-02-16 DIAGNOSIS — O0990 Supervision of high risk pregnancy, unspecified, unspecified trimester: Secondary | ICD-10-CM

## 2017-02-16 DIAGNOSIS — O24410 Gestational diabetes mellitus in pregnancy, diet controlled: Secondary | ICD-10-CM | POA: Diagnosis not present

## 2017-02-16 DIAGNOSIS — R0989 Other specified symptoms and signs involving the circulatory and respiratory systems: Secondary | ICD-10-CM

## 2017-02-16 DIAGNOSIS — O479 False labor, unspecified: Secondary | ICD-10-CM

## 2017-02-16 LAB — COMPREHENSIVE METABOLIC PANEL
ALBUMIN: 3.1 g/dL — AB (ref 3.5–5.0)
ALT: 16 U/L (ref 14–54)
AST: 22 U/L (ref 15–41)
Alkaline Phosphatase: 194 U/L — ABNORMAL HIGH (ref 38–126)
Anion gap: 9 (ref 5–15)
BUN: 11 mg/dL (ref 6–20)
CHLORIDE: 104 mmol/L (ref 101–111)
CO2: 21 mmol/L — AB (ref 22–32)
CREATININE: 0.54 mg/dL (ref 0.44–1.00)
Calcium: 9 mg/dL (ref 8.9–10.3)
GFR calc Af Amer: >60 mL/min (ref >60)
GFR calc non Af Amer: >60 mL/min (ref >60)
GLUCOSE: 77 mg/dL (ref 65–99)
POTASSIUM: 3.4 mmol/L — AB (ref 3.5–5.1)
SODIUM: 134 mmol/L — AB (ref 135–145)
Total Bilirubin: 0.3 mg/dL (ref 0.3–1.2)
Total Protein: 7 g/dL (ref 6.5–8.1)

## 2017-02-16 LAB — URINALYSIS, ROUTINE W REFLEX MICROSCOPIC
Bilirubin Urine: NEGATIVE
GLUCOSE, UA: NEGATIVE mg/dL
Ketones, ur: NEGATIVE mg/dL
NITRITE: NEGATIVE
PH: 6 (ref 5.0–8.0)
PROTEIN: NEGATIVE mg/dL
Specific Gravity, Urine: 1.008 (ref 1.005–1.030)

## 2017-02-16 LAB — CBC
HEMATOCRIT: 38.3 % (ref 36.0–46.0)
Hemoglobin: 13 g/dL (ref 12.0–15.0)
MCH: 29.4 pg (ref 26.0–34.0)
MCHC: 33.9 g/dL (ref 30.0–36.0)
MCV: 86.7 fL (ref 78.0–100.0)
Platelets: 239 10*3/uL (ref 150–400)
RBC: 4.42 MIL/uL (ref 3.87–5.11)
RDW: 14.7 % (ref 11.5–15.5)
WBC: 10.1 10*3/uL (ref 4.0–10.5)

## 2017-02-16 NOTE — MAU Provider Note (Signed)
Chief Complaint:  Back Pain   First Provider Initiated Contact with Patient 02/16/17 2307     HPI: Aimee Lopez is a 21 y.o. G2P0010 at 51w6dwho presents to maternity admissions reporting headache with contractions. Primarily came for a labor eval but reported a headache and had one elevated BP, so I was asked to see her.. She reports good fetal movement, denies LOF, vaginal bleeding, vaginal itching/burning, urinary symptoms, h/a, dizziness, n/v, diarrhea, constipation or fever/chills.  She denies visual changes or RUQ abdominal pain.  Back Pain  This is a new problem. The current episode started in the past 7 days. The problem occurs constantly. The problem is unchanged. The pain is present in the lumbar spine. Associated symptoms include abdominal pain, headaches and pelvic pain. Pertinent negatives include no dysuria, leg pain or weakness. She has tried nothing for the symptoms.  Headache   This is a new problem. The current episode started today. The problem has been unchanged. The pain does not radiate. The quality of the pain is described as aching. Associated symptoms include abdominal pain and back pain. Pertinent negatives include no weakness. Nothing aggravates the symptoms. She has tried nothing for the symptoms.    RN Note: Pt here with c/o contractions for the last couple of hours; has had some spotting and a headache.     Past Medical History: Past Medical History:  Diagnosis Date  . Medical history non-contributory     Past obstetric history: OB History  Gravida Para Term Preterm AB Living  2 0 0 0 1 0  SAB TAB Ectopic Multiple Live Births  1 0 0 0 0    # Outcome Date GA Lbr Len/2nd Weight Sex Delivery Anes PTL Lv  2 Current           1 SAB 2016              Past Surgical History: Past Surgical History:  Procedure Laterality Date  . NO PAST SURGERIES      Family History: Family History  Problem Relation Age of Onset  . Diabetes Mother   . Diabetes  Father   . Diabetes Maternal Aunt     Social History: Social History   Tobacco Use  . Smoking status: Former Smoker    Last attempt to quit: 06/17/2016    Years since quitting: 0.6  . Smokeless tobacco: Never Used  Substance Use Topics  . Alcohol use: No  . Drug use: Yes    Types: Marijuana    Comment: Denies use with pregnancy    Allergies: No Known Allergies  Meds:  Medications Prior to Admission  Medication Sig Dispense Refill Last Dose  . ACCU-CHEK FASTCLIX LANCETS MISC 1 Device by Percutaneous route 4 (four) times daily. 100 each 12 02/16/2017 at Unknown time  . glucose blood (ACCU-CHEK GUIDE) test strip Use as instructed 100 each 12 02/16/2017 at Unknown time  . Prenatal Vit-Fe Fumarate-FA (PREPLUS) 27-1 MG TABS Take 1 tablet by mouth daily. 30 tablet 13 02/16/2017 at Unknown time  . metroNIDAZOLE (FLAGYL) 500 MG tablet Take 1 tablet (500 mg total) by mouth 2 (two) times daily. 14 tablet 0 Taking    I have reviewed patient's Past Medical Hx, Surgical Hx, Family Hx, Social Hx, medications and allergies.   ROS:  Review of Systems  Gastrointestinal: Positive for abdominal pain.  Genitourinary: Positive for pelvic pain. Negative for dysuria.  Musculoskeletal: Positive for back pain.  Neurological: Positive for headaches. Negative for weakness.  Other systems negative  Physical Exam   Patient Vitals for the past 24 hrs:  BP Temp Pulse Resp Height Weight  02/16/17 2250 131/67 - 68 - - -  02/16/17 2235 - - - - 5\' 2"  (1.575 m) 202 lb (91.6 kg)  02/16/17 2225 130/73 - 75 - - -  02/16/17 2215 (!) 145/91 98.7 F (37.1 C) 76 18 - -   Vitals:   02/16/17 2235 02/16/17 2250 02/16/17 2353 02/17/17 0033  BP:  131/67 127/73 134/69  Pulse:  68 69 68  Resp:    16  Temp:      Weight: 202 lb (91.6 kg)     Height: 5\' 2"  (1.575 m)       Constitutional: Well-developed, well-nourished female in no acute distress.  Cardiovascular: normal rate and rhythm Respiratory: normal  effort, clear to auscultation bilaterally GI: Abd soft, non-tender, gravid appropriate for gestational age.   No rebound or guarding. MS: Extremities nontender, no edema, normal ROM Neurologic: Alert and oriented x 4.  GU: Neg CVAT.  PELVIC EXAM:  Dilation: 1 Effacement (%): Thick Cervical Position: Posterior Station: -3 Presentation: Vertex Exam by:: B Mosca RN  FHT:  Baseline 140 , moderate variability, accelerations present, no decelerations Contractions:  Irregular     Labs: Results for orders placed or performed during the hospital encounter of 02/16/17 (from the past 24 hour(s))  Urinalysis, Routine w reflex microscopic     Status: Abnormal   Collection Time: 02/16/17 10:35 PM  Result Value Ref Range   Color, Urine YELLOW YELLOW   APPearance HAZY (A) CLEAR   Specific Gravity, Urine 1.008 1.005 - 1.030   pH 6.0 5.0 - 8.0   Glucose, UA NEGATIVE NEGATIVE mg/dL   Hgb urine dipstick LARGE (A) NEGATIVE   Bilirubin Urine NEGATIVE NEGATIVE   Ketones, ur NEGATIVE NEGATIVE mg/dL   Protein, ur NEGATIVE NEGATIVE mg/dL   Nitrite NEGATIVE NEGATIVE   Leukocytes, UA LARGE (A) NEGATIVE   RBC / HPF 0-5 0 - 5 RBC/hpf   WBC, UA 6-30 0 - 5 WBC/hpf   Bacteria, UA RARE (A) NONE SEEN   Squamous Epithelial / LPF 6-30 (A) NONE SEEN   Mucus PRESENT   Protein / creatinine ratio, urine     Status: None   Collection Time: 02/16/17 10:35 PM  Result Value Ref Range   Creatinine, Urine 31.00 mg/dL   Total Protein, Urine <6 mg/dL   Protein Creatinine Ratio        0.00 - 0.15 mg/mg[Cre]  CBC     Status: None   Collection Time: 02/16/17 11:08 PM  Result Value Ref Range   WBC 10.1 4.0 - 10.5 K/uL   RBC 4.42 3.87 - 5.11 MIL/uL   Hemoglobin 13.0 12.0 - 15.0 g/dL   HCT 40.938.3 81.136.0 - 91.446.0 %   MCV 86.7 78.0 - 100.0 fL   MCH 29.4 26.0 - 34.0 pg   MCHC 33.9 30.0 - 36.0 g/dL   RDW 78.214.7 95.611.5 - 21.315.5 %   Platelets 239 150 - 400 K/uL  Comprehensive metabolic panel     Status: Abnormal   Collection Time:  02/16/17 11:08 PM  Result Value Ref Range   Sodium 134 (L) 135 - 145 mmol/L   Potassium 3.4 (L) 3.5 - 5.1 mmol/L   Chloride 104 101 - 111 mmol/L   CO2 21 (L) 22 - 32 mmol/L   Glucose, Bld 77 65 - 99 mg/dL   BUN 11 6 - 20 mg/dL  Creatinine, Ser 0.54 0.44 - 1.00 mg/dL   Calcium 9.0 8.9 - 16.110.3 mg/dL   Total Protein 7.0 6.5 - 8.1 g/dL   Albumin 3.1 (L) 3.5 - 5.0 g/dL   AST 22 15 - 41 U/L   ALT 16 14 - 54 U/L   Alkaline Phosphatase 194 (H) 38 - 126 U/L   Total Bilirubin 0.3 0.3 - 1.2 mg/dL   GFR calc non Af Amer >60 >60 mL/min   GFR calc Af Amer >60 >60 mL/min   Anion gap 9 5 - 15    B/Positive/-- (04/16 0000)  Imaging:    MAU Course/MDM: I have ordered labs and reviewed results. Preeclampsia labs normal BPs normalized NST reviewed and found reactive, Category I  Treatments in MAU included EFM.    Assessment: 1. Diet controlled gestational diabetes mellitus (GDM) in third trimester   2. Supervision of high risk pregnancy, antepartum   3.     Isolated hypertension x 1 value  Plan: Discharge home Labor precautions and fetal kick counts Follow up in Office for prenatal visits and recheck  Encouraged to return here or to other Urgent Care/ED if she develops worsening of symptoms, increase in pain, fever, or other concerning symptoms.   Pt stable at time of discharge.  Wynelle BourgeoisMarie Namya Voges CNM, MSN Certified Nurse-Midwife 02/16/2017 11:08 PM

## 2017-02-16 NOTE — MAU Note (Addendum)
Pt reports pain in her back for about 1 hour. Pt describes the pain as constant. Rates pain 3/10. + Fm. Denies LOF. Pt reports bleeding everyday for 2 weeks when she wipes or urinates. Pt states that she has burning when urinating since today. Also has a headache

## 2017-02-16 NOTE — MAU Note (Signed)
Pt here with c/o contractions for the last couple of hours; has had some spotting and a headache.

## 2017-02-17 DIAGNOSIS — Z3A38 38 weeks gestation of pregnancy: Secondary | ICD-10-CM

## 2017-02-17 DIAGNOSIS — O24410 Gestational diabetes mellitus in pregnancy, diet controlled: Secondary | ICD-10-CM | POA: Diagnosis not present

## 2017-02-17 DIAGNOSIS — O471 False labor at or after 37 completed weeks of gestation: Secondary | ICD-10-CM

## 2017-02-17 LAB — PROTEIN / CREATININE RATIO, URINE: Creatinine, Urine: 31 mg/dL

## 2017-02-17 NOTE — Discharge Instructions (Signed)
Vaginal Delivery Vaginal delivery means that you will give birth by pushing your baby out of your birth canal (vagina). A team of health care providers will help you before, during, and after vaginal delivery. Birth experiences are unique for every woman and every pregnancy, and birth experiences vary depending on where you choose to give birth. What should I do to prepare for my baby's birth? Before your baby is born, it is important to talk with your health care provider about:  Your labor and delivery preferences. These may include: ? Medicines that you may be given. ? How you will manage your pain. This might include non-medical pain relief techniques or injectable pain relief such as epidural analgesia. ? How you and your baby will be monitored during labor and delivery. ? Who may be in the labor and delivery room with you. ? Your feelings about surgical delivery of your baby (cesarean delivery, or C-section) if this becomes necessary. ? Your feelings about receiving donated blood through an IV tube (blood transfusion) if this becomes necessary.  Whether you are able: ? To take pictures or videos of the birth. ? To eat during labor and delivery. ? To move around, walk, or change positions during labor and delivery.  What to expect after your baby is born, such as: ? Whether delayed umbilical cord clamping and cutting is offered. ? Who will care for your baby right after birth. ? Medicines or tests that may be recommended for your baby. ? Whether breastfeeding is supported in your hospital or birth center. ? How long you will be in the hospital or birth center.  How any medical conditions you have may affect your baby or your labor and delivery experience.  To prepare for your baby's birth, you should also:  Attend all of your health care visits before delivery (prenatal visits) as recommended by your health care provider. This is important.  Prepare your home for your baby's  arrival. Make sure that you have: ? Diapers. ? Baby clothing. ? Feeding equipment. ? Safe sleeping arrangements for you and your baby.  Install a car seat in your vehicle. Have your car seat checked by a certified car seat installer to make sure that it is installed safely.  Think about who will help you with your new baby at home for at least the first several weeks after delivery.  What can I expect when I arrive at the birth center or hospital? Once you are in labor and have been admitted into the hospital or birth center, your health care provider may:  Review your pregnancy history and any concerns you have.  Insert an IV tube into one of your veins. This is used to give you fluids and medicines.  Check your blood pressure, pulse, temperature, and heart rate (vital signs).  Check whether your bag of water (amniotic sac) has broken (ruptured).  Talk with you about your birth plan and discuss pain control options.  Monitoring Your health care provider may monitor your contractions (uterine monitoring) and your baby's heart rate (fetal monitoring). You may need to be monitored:  Often, but not continuously (intermittently).  All the time or for long periods at a time (continuously). Continuous monitoring may be needed if: ? You are taking certain medicines, such as medicine to relieve pain or make your contractions stronger. ? You have pregnancy or labor complications.  Monitoring may be done by:  Placing a special stethoscope or a handheld monitoring device on your abdomen to   check your baby's heartbeat, and feeling your abdomen for contractions. This method of monitoring does not continuously record your baby's heartbeat or your contractions.  Placing monitors on your abdomen (external monitors) to record your baby's heartbeat and the frequency and length of contractions. You may not have to wear external monitors all the time.  Placing monitors inside of your uterus  (internal monitors) to record your baby's heartbeat and the frequency, length, and strength of your contractions. ? Your health care provider may use internal monitors if he or she needs more information about the strength of your contractions or your baby's heart rate. ? Internal monitors are put in place by passing a thin, flexible wire through your vagina and into your uterus. Depending on the type of monitor, it may remain in your uterus or on your baby's head until birth. ? Your health care provider will discuss the benefits and risks of internal monitoring with you and will ask for your permission before inserting the monitors.  Telemetry. This is a type of continuous monitoring that can be done with external or internal monitors. Instead of having to stay in bed, you are able to move around during telemetry. Ask your health care provider if telemetry is an option for you.  Physical exam Your health care provider may perform a physical exam. This may include:  Checking whether your baby is positioned: ? With the head toward your vagina (head-down). This is most common. ? With the head toward the top of your uterus (head-up or breech). If your baby is in a breech position, your health care provider may try to turn your baby to a head-down position so you can deliver vaginally. If it does not seem that your baby can be born vaginally, your provider may recommend surgery to deliver your baby. In rare cases, you may be able to deliver vaginally if your baby is head-up (breech delivery). ? Lying sideways (transverse). Babies that are lying sideways cannot be delivered vaginally.  Checking your cervix to determine: ? Whether it is thinning out (effacing). ? Whether it is opening up (dilating). ? How low your baby has moved into your birth canal.  What are the three stages of labor and delivery?  Normal labor and delivery is divided into the following three stages: Stage 1  Stage 1 is the  longest stage of labor, and it can last for hours or days. Stage 1 includes: ? Early labor. This is when contractions may be irregular, or regular and mild. Generally, early labor contractions are more than 10 minutes apart. ? Active labor. This is when contractions get longer, more regular, more frequent, and more intense. ? The transition phase. This is when contractions happen very close together, are very intense, and may last longer than during any other part of labor.  Contractions generally feel mild, infrequent, and irregular at first. They get stronger, more frequent (about every 2-3 minutes), and more regular as you progress from early labor through active labor and transition.  Many women progress through stage 1 naturally, but you may need help to continue making progress. If this happens, your health care provider may talk with you about: ? Rupturing your amniotic sac if it has not ruptured yet. ? Giving you medicine to help make your contractions stronger and more frequent.  Stage 1 ends when your cervix is completely dilated to 4 inches (10 cm) and completely effaced. This happens at the end of the transition phase. Stage 2  Once   your cervix is completely effaced and dilated to 4 inches (10 cm), you may start to feel an urge to push. It is common for the body to naturally take a rest before feeling the urge to push, especially if you received an epidural or certain other pain medicines. This rest period may last for up to 1-2 hours, depending on your unique labor experience.  During stage 2, contractions are generally less painful, because pushing helps relieve contraction pain. Instead of contraction pain, you may feel stretching and burning pain, especially when the widest part of your baby's head passes through the vaginal opening (crowning).  Your health care provider will closely monitor your pushing progress and your baby's progress through the vagina during stage 2.  Your  health care provider may massage the area of skin between your vaginal opening and anus (perineum) or apply warm compresses to your perineum. This helps it stretch as the baby's head starts to crown, which can help prevent perineal tearing. ? In some cases, an incision may be made in your perineum (episiotomy) to allow the baby to pass through the vaginal opening. An episiotomy helps to make the opening of the vagina larger to allow more room for the baby to fit through.  It is very important to breathe and focus so your health care provider can control the delivery of your baby's head. Your health care provider may have you decrease the intensity of your pushing, to help prevent perineal tearing.  After delivery of your baby's head, the shoulders and the rest of the body generally deliver very quickly and without difficulty.  Once your baby is delivered, the umbilical cord may be cut right away, or this may be delayed for 1-2 minutes, depending on your baby's health. This may vary among health care providers, hospitals, and birth centers.  If you and your baby are healthy enough, your baby may be placed on your chest or abdomen to help maintain the baby's temperature and to help you bond with each other. Some mothers and babies start breastfeeding at this time. Your health care team will dry your baby and help keep your baby warm during this time.  Your baby may need immediate care if he or she: ? Showed signs of distress during labor. ? Has a medical condition. ? Was born too early (prematurely). ? Had a bowel movement before birth (meconium). ? Shows signs of difficulty transitioning from being inside the uterus to being outside of the uterus. If you are planning to breastfeed, your health care team will help you begin a feeding. Stage 3  The third stage of labor starts immediately after the birth of your baby and ends after you deliver the placenta. The placenta is an organ that develops  during pregnancy to provide oxygen and nutrients to your baby in the womb.  Delivering the placenta may require some pushing, and you may have mild contractions. Breastfeeding can stimulate contractions to help you deliver the placenta.  After the placenta is delivered, your uterus should tighten (contract) and become firm. This helps to stop bleeding in your uterus. To help your uterus contract and to control bleeding, your health care provider may: ? Give you medicine by injection, through an IV tube, by mouth, or through your rectum (rectally). ? Massage your abdomen or perform a vaginal exam to remove any blood clots that are left in your uterus. ? Empty your bladder by placing a thin, flexible tube (catheter) into your bladder. ? Encourage   you to breastfeed your baby. After labor is over, you and your baby will be monitored closely to ensure that you are both healthy until you are ready to go home. Your health care team will teach you how to care for yourself and your baby. This information is not intended to replace advice given to you by your health care provider. Make sure you discuss any questions you have with your health care provider. Document Released: 01/02/2008 Document Revised: 10/13/2015 Document Reviewed: 04/09/2015 Elsevier Interactive Patient Education  2018 [employer]  

## 2017-02-20 ENCOUNTER — Ambulatory Visit (INDEPENDENT_AMBULATORY_CARE_PROVIDER_SITE_OTHER): Payer: Self-pay | Admitting: Student

## 2017-02-20 ENCOUNTER — Telehealth (HOSPITAL_COMMUNITY): Payer: Self-pay | Admitting: *Deleted

## 2017-02-20 VITALS — BP 109/50 | HR 64 | Wt 202.1 lb

## 2017-02-20 DIAGNOSIS — O24410 Gestational diabetes mellitus in pregnancy, diet controlled: Secondary | ICD-10-CM

## 2017-02-20 DIAGNOSIS — O0990 Supervision of high risk pregnancy, unspecified, unspecified trimester: Secondary | ICD-10-CM

## 2017-02-20 DIAGNOSIS — O0993 Supervision of high risk pregnancy, unspecified, third trimester: Secondary | ICD-10-CM

## 2017-02-20 NOTE — Patient Instructions (Signed)
Induccin del trabajo de parto  (Labor Induction)  Se denomina induccin del trabajo de parto cuando se inician acciones para hacer que una mujer embarazada comience el trabajo de parto. La mayora de las mujeres comienzan el trabajo de parto sin ayuda entre las semanas 37 y 42 del embarazo. Cuando esto no ocurre o cuando hay una necesidad mdica, pueden utilizarse diferentes mtodos para inducirlo. La induccin del trabajo de parto hace que el tero se contraiga. Tambin hace que el cuello del tero se ablandemadure), se abra (se dilate), y se afine (se borre). Generalmente el trabajo de parto no se induce antes de las 39 semanas excepto que haya un problema con el beb o con la madre.  Antes de inducir el trabajo de parto, el mdico considerar cierto nmero de factores incluyendo los siguientes:   El estado del beb.   Cuntas semanas tiene de embarazo.   La madurez de los pulmones del beb.   El estado del cuello del tero.   La posicin del beb.  CULES SON LOS MOTIVOS PARA INDUCIR UN PARTO?  El trabajo de parto puede inducirse por las siguientes razones:   La salud del beb o de la madre estn en riesgo.   El embarazo se ha pasado de trmino en 1 semana o ms.   Ha roto la bolsa de aguas pero no se ha iniciado el trabajo de parto por s mismo.   La madre tiene algn trastorno de salud o una enfermedad grave, como hipertensin arterial, una infeccin, desprendimiento abrupto de la placenta o diabetes.   Hay escaso lquido amnitico alrededor del beb.   El beb presenta sufrimiento.  La conveniencia o el deseo de que el beb nazca en una cierta fecha no es un motivo para inducir el parto.  CULES SON LOS MTODOS UTILIZADOS PARA INDUCIR EL TRABAJO DE PARTO?  Algunos mtodos de induccin del trabajo de parto son:   Administracin del medicamentos prostaglandina. Este medicamento hace que el cuello uterino se dilate y madure. Este medicamento tambin iniciar las contracciones. Puede tomarse por  boca o insertarse en la vagina en forma de supositorio.   Insercin en la vagina de un tubo delgado (catter) con un baln en el extremo para dilatar el cuello del tero. Una vez insertado, el baln se infla con agua, lo que provoca la apertura del cuello del tero.   Ruptura de las membranas. El mdico separa el saco amnitico del cuello uterino, haciendo que el cuello uterino se distienda y cause la liberacin de la hormona llamada progesterona. Esto hace que el tero se contraiga. Este procedimiento se realiza durante una visita al consultorio mdico. Le indicarn que vuelva a su casa y espere que se inicien las contracciones. Luego tendr que volver para la induccin.   Ruptura de la bolsa de aguas. El mdico romper el saco amnitico con un pequeo instrumento. Una vez que el saco amnitico se rompe, las contracciones deben comenzar. Pueden pasar algunas horas hasta que haga efecto.   Medicamentos que desencadenen o intensifiquen las contracciones. Se lo administrarn a travs de un catter por va intravenosa (IV) que se inserta en una de las venas del brazo.  Todos los mtodos de induccin, excepto la ruptura de membranas, se realizan en el hospital. La induccin se realizar en el hospital, de modo que usted y el beb puedan ser controlados cuidadosamente.  CUNTO TIEMPO LLEVA INDUCIR EL TRABAJO DE PARTO?  Algunas inducciones pueden demorar entre 2 y 3 das. Generalmente lleva menos   tiempo, dependiendo del estado del cuello del tero. Puede tomar ms tiempo si la induccin se realiza en etapas tempranas del embarazo o es su primer embarazo. Si han pasado 2 o 3 das y no se inicia el trabajo de parto, podrn enviarla a su casa o realizar una cesrea.  CULES SON LOS RIESGOS ASOCIADOS CON LA INDUCCiN DEL TRABAJO DE PARTO?  Algunos de los riesgos de la induccin son:   Cambios en la frecuencia cardaca fetal, por ejemplo los latidos son demasiado rpidos, o lentos, o errticos.   Riesgo de distrs  fetal.   Posibilidad de infeccin en la madre o el beb.   Aumento de la posibilidad de que sea necesaria una cesrea.   Ruptura (abrupcin) de la placenta del tero (raro).   Ruptura uterina (muy raro).  Cuando es necesario realizar la induccin por razones mdicas, los beneficios deben superar a los riesgos.  CULES SON ALGUNAS RAZONES PARA NO INDUCIR EL TRABAJO DE PARTO?  La induccin no debe realizarse si:   Se demuestra que el beb no tolera el trabajo de parto.   Fue sometida anteriormente a cirugas en el tero, como una miomectoma o le han extirpado fibromas.   La placenta est en una posicin muy baja en el tero y obstruye la abertura del cuello (placenta previa).   El beb no est ubicado con la cabeza hacia bajo.   El cordn umbilical cae hacia el canal de parto, adelante del beb. Esto puede cortar el suministro de sangre y oxgeno al beb.   Fue sometida a una cesrea anteriormente.   Hay circunstancias poco habituales, como que el beb es extremadamente prematuro.  Esta informacin no tiene como fin reemplazar el consejo del mdico. Asegrese de hacerle al mdico cualquier pregunta que tenga.  Document Released: 07/02/2007 Document Revised: 07/17/2015 Document Reviewed: 10/22/2012  Elsevier Interactive Patient Education  2017 [employer]

## 2017-02-20 NOTE — Progress Notes (Signed)
   PRENATAL VISIT NOTE  Subjective:  Aimee Lopez is a 21 y.o. G2P0010 at 4373w3d being seen today for ongoing prenatal care.  She is currently monitored for the following issues for this high-risk pregnancy and has Gestational diabetes mellitus and Supervision of high risk pregnancy, antepartum on their problem list.  Patient reports no complaints.  Contractions: Irregular. Vag. Bleeding: None.  Movement: Present. Denies leaking of fluid.   The following portions of the patient's history were reviewed and updated as appropriate: allergies, current medications, past family history, past medical history, past social history, past surgical history and problem list. Problem list updated.  Objective:   Vitals:   02/20/17 0944  BP: (!) 109/50  Pulse: 64  Weight: 202 lb 1.6 oz (91.7 kg)    Fetal Status: Fetal Heart Rate (bpm): 146 Fundal Height: 39 cm Movement: Present     General:  Alert, oriented and cooperative. Patient is in no acute distress.  Skin: Skin is warm and dry. No rash noted.   Cardiovascular: Normal heart rate noted  Respiratory: Normal respiratory effort, no problems with respiration noted  Abdomen: Soft, gravid, appropriate for gestational age.  Pain/Pressure: Present     Pelvic: Cervical exam deferred          Extremities: Normal range of motion.  Edema: Trace  Mental Status:  Normal mood and affect. Normal behavior. Normal judgment and thought content.   Assessment and Plan:  Pregnancy: G2P0010 at 8973w3d  1. Supervision of high risk pregnancy, antepartum Induction scheduled for Monday, November 19th.  Growth US showed growth at 76%  2. Diet controlled gestational diabetes mellitus (GDM) in third trimester Patient's blood sugars range from fasting 89-94 and PP high of 117.   Term labor symptoms and general obstetric precautions including but not limited to vaginal bleeding, contractions, leaking of fluid and fetal movement were reviewed in detail with the  patient. Please refer to After Visit Summary for other counseling recommendations.  No Follow-up on file.   Marylene LandKathryn Lorraine Kooistra, CNM

## 2017-02-20 NOTE — Telephone Encounter (Signed)
Preadmission screen [id number] interpreter number

## 2017-02-24 ENCOUNTER — Inpatient Hospital Stay (HOSPITAL_COMMUNITY)
Admission: RE | Admit: 2017-02-24 | Discharge: 2017-02-28 | DRG: 788 | Disposition: A | Discharge status: Home / Self Care | Payer: Medicaid Other | Source: Ambulatory Visit | Attending: Family Medicine | Admitting: Family Medicine

## 2017-02-24 ENCOUNTER — Encounter (HOSPITAL_COMMUNITY): Payer: Self-pay

## 2017-02-24 ENCOUNTER — Encounter (HOSPITAL_COMMUNITY): Payer: Self-pay | Admitting: Anesthesiology

## 2017-02-24 DIAGNOSIS — Z8632 Personal history of gestational diabetes: Secondary | ICD-10-CM | POA: Diagnosis present

## 2017-02-24 DIAGNOSIS — Z880 Allergy status to penicillin: Secondary | ICD-10-CM | POA: Diagnosis not present

## 2017-02-24 DIAGNOSIS — O0990 Supervision of high risk pregnancy, unspecified, unspecified trimester: Secondary | ICD-10-CM

## 2017-02-24 DIAGNOSIS — Z3A40 40 weeks gestation of pregnancy: Secondary | ICD-10-CM

## 2017-02-24 DIAGNOSIS — O9081 Anemia of the puerperium: Secondary | ICD-10-CM | POA: Diagnosis not present

## 2017-02-24 DIAGNOSIS — O24410 Gestational diabetes mellitus in pregnancy, diet controlled: Secondary | ICD-10-CM

## 2017-02-24 DIAGNOSIS — Z87891 Personal history of nicotine dependence: Secondary | ICD-10-CM

## 2017-02-24 DIAGNOSIS — O24420 Gestational diabetes mellitus in childbirth, diet controlled: Principal | ICD-10-CM | POA: Diagnosis present

## 2017-02-24 DIAGNOSIS — Z98891 History of uterine scar from previous surgery: Secondary | ICD-10-CM

## 2017-02-24 DIAGNOSIS — Z3490 Encounter for supervision of normal pregnancy, unspecified, unspecified trimester: Secondary | ICD-10-CM | POA: Diagnosis present

## 2017-02-24 LAB — GLUCOSE, CAPILLARY
GLUCOSE-CAPILLARY: 70 mg/dL (ref 65–99)
GLUCOSE-CAPILLARY: 85 mg/dL (ref 65–99)
Glucose-Capillary: 82 mg/dL (ref 65–99)

## 2017-02-24 LAB — CBC
HEMATOCRIT: 41.5 % (ref 36.0–46.0)
HEMOGLOBIN: 13.9 g/dL (ref 12.0–15.0)
MCH: 29.6 pg (ref 26.0–34.0)
MCHC: 33.5 g/dL (ref 30.0–36.0)
MCV: 88.5 fL (ref 78.0–100.0)
Platelets: 236 10*3/uL (ref 150–400)
RBC: 4.69 MIL/uL (ref 3.87–5.11)
RDW: 14.5 % (ref 11.5–15.5)
WBC: 9.7 10*3/uL (ref 4.0–10.5)

## 2017-02-24 LAB — ABO/RH: ABO/RH(D): B POS

## 2017-02-24 LAB — TYPE AND SCREEN
ABO/RH(D): B POS
Antibody Screen: NEGATIVE

## 2017-02-24 MED ORDER — OXYTOCIN 40 UNITS IN LACTATED RINGERS INFUSION - SIMPLE MED: 2.5000 [IU]/h | INTRAVENOUS | Status: DC

## 2017-02-24 MED ORDER — LIDOCAINE HCL (PF) 1 % IJ SOLN: 30.0000 mL | INTRAMUSCULAR | Status: DC | PRN

## 2017-02-24 MED ORDER — FLEET ENEMA 7-19 GM/118ML RE ENEM: 1.0000 | ENEMA | RECTAL | Status: DC | PRN

## 2017-02-24 MED ORDER — OXYCODONE-ACETAMINOPHEN 5-325 MG PO TABS: 2.0000 | ORAL_TABLET | ORAL | Status: DC | PRN

## 2017-02-24 MED ORDER — OXYTOCIN 40 UNITS IN LACTATED RINGERS INFUSION - SIMPLE MED
1.0000 m[IU]/min | INTRAVENOUS | Status: DC
  Administered 2017-02-24: 2 m[IU]/min via INTRAVENOUS
  Filled 2017-02-24: qty 1000

## 2017-02-24 MED ORDER — LACTATED RINGERS IV SOLN
500.0000 mL | INTRAVENOUS | Status: DC | PRN
  Administered 2017-02-25: 500 mL via INTRAVENOUS
  Administered 2017-02-25: 300 mL via INTRAVENOUS
  Administered 2017-02-25: 500 mL via INTRAVENOUS

## 2017-02-24 MED ORDER — MISOPROSTOL 25 MCG QUARTER TABLET
25.0000 ug | ORAL_TABLET | ORAL | Status: DC | PRN
  Administered 2017-02-24: 25 ug via VAGINAL
  Filled 2017-02-24 (×2): qty 1

## 2017-02-24 MED ORDER — ACETAMINOPHEN 325 MG PO TABS: 650.0000 mg | ORAL_TABLET | ORAL | Status: DC | PRN

## 2017-02-24 MED ORDER — ONDANSETRON HCL 4 MG/2ML IJ SOLN: 4.0000 mg | Freq: Four times a day (QID) | INTRAMUSCULAR | Status: DC | PRN

## 2017-02-24 MED ORDER — OXYCODONE-ACETAMINOPHEN 5-325 MG PO TABS: 1.0000 | ORAL_TABLET | ORAL | Status: DC | PRN

## 2017-02-24 MED ORDER — TERBUTALINE SULFATE 1 MG/ML IJ SOLN
0.2500 mg | Freq: Once | INTRAMUSCULAR | Status: DC | PRN
  Filled 2017-02-24: qty 1

## 2017-02-24 MED ORDER — OXYTOCIN BOLUS FROM INFUSION: 500.0000 mL | Freq: Once | INTRAVENOUS | Status: DC

## 2017-02-24 MED ORDER — SOD CITRATE-CITRIC ACID 500-334 MG/5ML PO SOLN
30.0000 mL | ORAL | Status: DC | PRN
  Filled 2017-02-24: qty 15

## 2017-02-24 MED ORDER — TERBUTALINE SULFATE 1 MG/ML IJ SOLN
0.2500 mg | Freq: Once | INTRAMUSCULAR | Status: AC | PRN
  Administered 2017-02-25: 0.25 mg via SUBCUTANEOUS

## 2017-02-24 MED ORDER — LACTATED RINGERS IV SOLN
INTRAVENOUS | Status: DC
  Administered 2017-02-24: 125 mL via INTRAVENOUS
  Administered 2017-02-24: 19:00:00 via INTRAVENOUS

## 2017-02-24 NOTE — H&P (Signed)
OBSTETRIC ADMISSION HISTORY AND PHYSICAL  Aimee Lopez is a 21 y.o. female G2P0010 with IUP at 461w0d by LMP presenting for IOL for A1GDM. She was scheduled for midnight induction, but came at 12pm. She reports +FMs, No LOF, no VB, no blurry vision, headaches or peripheral edema, and RUQ pain.  She plans on Breast feeding. She request OCP for birth control. [facility]   Dating: By LMP --->  Estimated Date of Delivery: 02/24/17  U/S on 02/12/17: EFW 76%, 7lb 7oz  [facility]  Dating LMP Blood type: B/Positive/-- (04/16 0000)   Genetic Screen 1 Screen:    AFP:     Quad:     NIPS: Antibody:Negative (04/16 0000)  Anatomic US Limited at 18 weeks; rescan scheduled Rubella:  Immune  GTT Early:  168, failed 3hr --GDM RPR: Nonreactive (04/16 0000)   Flu vaccine 01/01/2017 HBsAg:   Negative  TDaP vaccine  11/28/2016                                             HIV:   Non- Reactive  Baby Food  Breast                                             GBS: Neg (For PCN allergy, check sensitivities)  Contraception  OCP ZOX:WRUEAVWUPap:Negative  Circumcision No   [facility] suggested    Support Person  Mother     Prenatal History/Complications:  Past Medical History: Past Medical History:  Diagnosis Date  . Medical history non-contributory     Past Surgical History: Past Surgical History:  Procedure Laterality Date  . NO PAST SURGERIES      Obstetrical History: OB History    Gravida Para Term Preterm AB Living   2 0 0 0 1 0   SAB TAB Ectopic Multiple Live Births   1 0 0 0 0      Social History: Social History   Socioeconomic History  . Marital status: Single    Spouse name: None  . Number of children: None  . Years of education: None  . Highest education level: None  Social Needs  . Financial resource strain: None  . Food insecurity - worry: None  . Food insecurity - inability: None  . Transportation needs - medical: None   . Transportation needs - non-medical: None  Occupational History  . None  Tobacco Use  . Smoking status: Former Smoker    Last attempt to quit: 06/17/2016    Years since quitting: 0.6  . Smokeless tobacco: Never Used  Substance and Sexual Activity  . Alcohol use: No  . Drug use: Yes    Types: Marijuana    Comment: Denies use with pregnancy  . Sexual activity: Not Currently    Birth control/protection: None  Other Topics Concern  . None  Social History Narrative  . None    Family History: Family History  Problem Relation Age of Onset  . Diabetes Mother   . Diabetes Father   . Diabetes Maternal Aunt     Allergies: No Known Allergies  Medications Prior to Admission  Medication Sig Dispense Refill Last Dose  . ACCU-CHEK FASTCLIX LANCETS MISC 1  Device by Percutaneous route 4 (four) times daily. 100 each 12 Taking  . glucose blood (ACCU-CHEK GUIDE) test strip Use as instructed 100 each 12 Taking  . Prenatal Vit-Fe Fumarate-FA (PREPLUS) 27-1 MG TABS Take 1 tablet by mouth daily. 30 tablet 13 Taking     Review of Systems   All systems reviewed and negative except as stated in HPI  Blood pressure 124/73, pulse 67, temperature 97.9 F (36.6 C), temperature source Oral, resp. rate 20, height 5\' 2"  (1.575 m), weight 202 lb (91.6 kg), last menstrual period 05/20/2016, unknown if currently breastfeeding. General appearance: alert, cooperative and no distress Lungs: clear to auscultation bilaterally Heart: regular rate and rhythm Abdomen: soft, non-tender; bowel sounds normal Pelvic: n/a Extremities: Homans sign is negative, no sign of DVT DTR's +2 Presentation: cephalic Fetal monitoringBaseline: 120 bpm, Variability: Good {> 6 bpm), Accelerations: Reactive and Decelerations: Absent Uterine activityFrequency: Every 4-6 minutes Dilation: 1 Effacement (%): 40 Station: -3 Exam by:: Mary SwazilandJordan Johnson, RN    Prenatal labs: ABO, Rh: --/--/B POS (11/19 1248) Antibody:  PENDING (11/19 1248) Rubella: Immune (04/26 0000) RPR: Non Reactive (09/06 1335)  HBsAg: Negative (04/26 0000)  HIV: Non-reactive (04/26 0000)  GBS: Negative (10/24 1121)   Prenatal Transfer Tool  Maternal Diabetes: Yes:  Diabetes Type:  Diet controlled Genetic Screening: Normal Maternal Ultrasounds/Referrals: Normal Fetal Ultrasounds or other Referrals:  None Maternal Substance Abuse:  No Significant Maternal Medications:  None Significant Maternal Lab Results: Lab values include: Group B Strep negative  Results for orders placed or performed during the hospital encounter of 02/24/17 (from the past 24 hour(s))  CBC   Collection Time: 02/24/17 12:48 PM  Result Value Ref Range   WBC 9.7 4.0 - 10.5 K/uL   RBC 4.69 3.87 - 5.11 MIL/uL   Hemoglobin 13.9 12.0 - 15.0 g/dL   HCT 09.841.5 11.936.0 - 14.746.0 %   MCV 88.5 78.0 - 100.0 fL   MCH 29.6 26.0 - 34.0 pg   MCHC 33.5 30.0 - 36.0 g/dL   RDW 82.914.5 56.211.5 - 13.015.5 %   Platelets 236 150 - 400 K/uL  Type and screen [facility]   Collection Time: 02/24/17 12:48 PM  Result Value Ref Range   ABO/RH(D) B POS    Antibody Screen PENDING    Sample Expiration 02/27/2017   Glucose, capillary   Collection Time: 02/24/17  1:20 PM  Result Value Ref Range   Glucose-Capillary 82 65 - 99 mg/dL    Patient Active Problem List   Diagnosis Date Noted  . Encounter for induction of labor 02/24/2017  . Gestational diabetes mellitus 08/21/2016  . Supervision of high risk pregnancy, antepartum 08/21/2016    Assessment/Plan:  Aimee Lopez is a 21 y.o. G2P0010 at 5437w0d here for IOL for A1GDM  #Labor: Cytotec for cervical ripening, plan foley bulb #Pain: n/a #FWB: Cat 1 #ID:  GBS neg #MOF: Breast #MOC: OCP #Circ:  no  Rolm BookbinderCaroline M Neill, CNM  02/24/2017, 1:25 PM

## 2017-02-24 NOTE — Progress Notes (Signed)
Labor Progress Note Chrystine OilerGloria Ruiz Guzman is a 21 y.o. G2P0010 at 7967w0d presented for IOL for A1GDM  S:  Patient comfortable, willing to try foley bulb again.  O:  BP 128/69   Pulse 80   Temp 97.8 F (36.6 C) (Oral)   Resp 17   Ht 5\' 2"  (1.575 m)   Wt 202 lb (91.6 kg)   LMP 05/20/2016   BMI 36.95 kg/m   Fetal Tracing:  Baseline: 140 Variability: moderate Accels: 15x15 Decels: none  Toco: 2-6  CVE: Dilation: 1 Effacement (%): 60 Cervical Position: Posterior Station: -1 Presentation: Vertex Exam by:: Cleone Slimaroline Calahan Pak, CNM   A&P: 21 y.o. G2P0010 4667w0d IOL for A1GDM #Labor: Progressing well. Foley bulb placed without difficulty, patient tolerate procedure well #Pain: prn per patient #FWB: Cat 1 #GBS negative  Rolm Bookbinderaroline M Ram Haugan, CNM 6:01 PM

## 2017-02-24 NOTE — Progress Notes (Signed)
Labor Progress Note Aimee OilerGloria Ruiz Lopez is a 21 y.o. G2P0010 at 5380w0d presented for IOL for A1GDM S: No complaints  O:  BP 139/76   Pulse 68   Temp 97.9 F (36.6 C) (Oral)   Resp 18   Ht 5\' 2"  (1.575 m)   Wt 202 lb (91.6 kg)   LMP 05/20/2016   BMI 36.95 kg/m  EFM: 130/mod var/pos acels/no decels  CVE: Dilation: 3.5 Effacement (%): 70 Cervical Position: Posterior Station: -1 Presentation: Vertex Exam by:: S moyer   A&P: 21 y.o. G2P0010 1880w0d here for IOL for A1GDM #Labor: pitocin at 4 Mu/min. Continue to increase pitocin. Consider AROM when cervix more dilated. #FWB: cat 1 #A1GDM- most recent CBG 70   Aimee Riess, DO 10:04 PM

## 2017-02-24 NOTE — Progress Notes (Signed)
RN called- foley bulb out and patient more comfortable. Reporting irregular contractions. Will offer to let patient eat and then start pitocin 2x2.  Dilation: 3.5 Effacement (%): 70 Cervical Position: Posterior Station: -1 Presentation: Vertex Exam by:: S moyer   Fetal Tracing:  Baseline: 140 Variability: moderate Accels: 15x15 Decels: none  Toco: 2-3  Aimee Lopez, CNM 02/24/17 7:36 PM

## 2017-02-24 NOTE — Progress Notes (Signed)
Labor Progress Note Chrystine OilerGloria Ruiz Lopez is a 21 y.o. G2P0010 at 381w0d presented for IOL for A1GDM  S:  Patient comfortable with occasional contractions  O:  BP 128/69   Pulse 80   Temp 97.8 F (36.6 C) (Oral)   Resp 17   Ht 5\' 2"  (1.575 m)   Wt 202 lb (91.6 kg)   LMP 05/20/2016   BMI 36.95 kg/m   Fetal Tracing:  Baseline: 120 Variability: moderate Accels: 15x15 Decels: none  Toco: 4-8  CVE: Dilation: 1 Effacement (%): 60 Cervical Position: Posterior Station: -1 Presentation: Vertex Exam by:: Cleone Slimaroline Blease Capaldi, CNM   A&P: 21 y.o. G2P0010 2481w0d IOL for A1GDM #Labor: Progressing well. Attempted foley bulb but unable to place. Will reevaluate with next cytotec #Pain: n/a #FWB: Cat 1 #GBS negative  Rolm Bookbinderaroline M Declyn Delsol, CNM 3:31 PM

## 2017-02-24 NOTE — Anesthesia Pain Management Evaluation Note (Signed)
  CRNA Pain Management Visit Note  Patient: Aimee Lopez, 21 y.o., female  "Hello I am a member of the anesthesia [facility]. We have an anesthesia team available at all times to provide care throughout the hospital, including epidural management and anesthesia for C-section. I don't know your plan for the delivery whether it a natural birth, water birth, IV sedation, nitrous supplementation, doula or epidural, but we want to meet your pain goals."   1.Was your pain managed to your expectations on prior hospitalizations?   No prior hospitalizations  2.What is your expectation for pain management during this hospitalization?     IV pain meds  3.How can we help you reach that goal? Epidural if needed  Record the patient's initial score and the patient's pain goal.   Pain: 2  Pain Goal: 6 [facility] wants you to be able to say your pain was always managed very well.  Aimee Lopez 02/24/2017

## 2017-02-25 ENCOUNTER — Encounter (HOSPITAL_COMMUNITY)
Admission: RE | Disposition: A | Discharge status: Home / Self Care | Payer: Self-pay | Source: Ambulatory Visit | Attending: Family Medicine

## 2017-02-25 ENCOUNTER — Encounter (HOSPITAL_COMMUNITY): Payer: Self-pay

## 2017-02-25 ENCOUNTER — Inpatient Hospital Stay (HOSPITAL_COMMUNITY): Payer: Medicaid Other | Admitting: Anesthesiology

## 2017-02-25 DIAGNOSIS — Z3A40 40 weeks gestation of pregnancy: Secondary | ICD-10-CM

## 2017-02-25 DIAGNOSIS — Z98891 History of uterine scar from previous surgery: Secondary | ICD-10-CM

## 2017-02-25 DIAGNOSIS — O24429 Gestational diabetes mellitus in childbirth, unspecified control: Secondary | ICD-10-CM

## 2017-02-25 LAB — GLUCOSE, CAPILLARY
GLUCOSE-CAPILLARY: 118 mg/dL — AB (ref 65–99)
Glucose-Capillary: 85 mg/dL (ref 65–99)
Glucose-Capillary: 98 mg/dL (ref 65–99)

## 2017-02-25 LAB — RPR: RPR: NONREACTIVE

## 2017-02-25 SURGERY — Surgical Case
Anesthesia: Epidural | Wound class: Clean Contaminated

## 2017-02-25 MED ORDER — ZOLPIDEM TARTRATE 5 MG PO TABS: 5.0000 mg | ORAL_TABLET | Freq: Every evening | ORAL | Status: DC | PRN

## 2017-02-25 MED ORDER — ACETAMINOPHEN 500 MG PO TABS
1000.0000 mg | ORAL_TABLET | Freq: Four times a day (QID) | ORAL | Status: AC
  Administered 2017-02-26 (×3): 1000 mg via ORAL
  Filled 2017-02-25 (×3): qty 2

## 2017-02-25 MED ORDER — PHENYLEPHRINE 40 MCG/ML (10ML) SYRINGE FOR IV PUSH (FOR BLOOD PRESSURE SUPPORT): 80.0000 ug | PREFILLED_SYRINGE | INTRAVENOUS | Status: DC | PRN

## 2017-02-25 MED ORDER — PRENATAL MULTIVITAMIN CH
1.0000 | ORAL_TABLET | Freq: Every day | ORAL | Status: DC
  Administered 2017-02-26 – 2017-02-28 (×3): 1 via ORAL
  Filled 2017-02-25 (×2): qty 1

## 2017-02-25 MED ORDER — PROMETHAZINE HCL 25 MG/ML IJ SOLN: 6.2500 mg | INTRAMUSCULAR | Status: DC | PRN

## 2017-02-25 MED ORDER — SENNOSIDES-DOCUSATE SODIUM 8.6-50 MG PO TABS
2.0000 | ORAL_TABLET | ORAL | Status: DC
  Administered 2017-02-26 – 2017-02-27 (×3): 2 via ORAL
  Filled 2017-02-25 (×3): qty 2

## 2017-02-25 MED ORDER — WITCH HAZEL-GLYCERIN EX PADS: 1.0000 "application " | MEDICATED_PAD | CUTANEOUS | Status: DC | PRN

## 2017-02-25 MED ORDER — MENTHOL 3 MG MT LOZG: 1.0000 | LOZENGE | OROMUCOSAL | Status: DC | PRN

## 2017-02-25 MED ORDER — HYDROMORPHONE HCL 1 MG/ML IJ SOLN
0.2500 mg | INTRAMUSCULAR | Status: DC | PRN
  Administered 2017-02-25: 0.25 mg via INTRAVENOUS

## 2017-02-25 MED ORDER — EPHEDRINE 5 MG/ML INJ: 10.0000 mg | INTRAVENOUS | Status: DC | PRN

## 2017-02-25 MED ORDER — FENTANYL CITRATE (PF) 100 MCG/2ML IJ SOLN
INTRAMUSCULAR | Status: DC | PRN
  Administered 2017-02-25: 100 ug via EPIDURAL

## 2017-02-25 MED ORDER — KETOROLAC TROMETHAMINE 30 MG/ML IJ SOLN
30.0000 mg | Freq: Four times a day (QID) | INTRAMUSCULAR | Status: AC | PRN
  Administered 2017-02-25: 30 mg via INTRAMUSCULAR

## 2017-02-25 MED ORDER — LIDOCAINE HCL (PF) 1 % IJ SOLN
INTRAMUSCULAR | Status: DC | PRN
  Administered 2017-02-25 (×2): 6 mL via EPIDURAL

## 2017-02-25 MED ORDER — SCOPOLAMINE 1 MG/3DAYS TD PT72: 1.0000 | MEDICATED_PATCH | Freq: Once | TRANSDERMAL | Status: DC

## 2017-02-25 MED ORDER — SCOPOLAMINE 1 MG/3DAYS TD PT72
MEDICATED_PATCH | TRANSDERMAL | Status: AC
  Filled 2017-02-25: qty 1

## 2017-02-25 MED ORDER — OXYTOCIN 40 UNITS IN LACTATED RINGERS INFUSION - SIMPLE MED: 2.5000 [IU]/h | INTRAVENOUS | Status: AC

## 2017-02-25 MED ORDER — NALBUPHINE HCL 10 MG/ML IJ SOLN: 5.0000 mg | Freq: Once | INTRAMUSCULAR | Status: DC | PRN

## 2017-02-25 MED ORDER — OXYCODONE HCL 5 MG PO TABS
5.0000 mg | ORAL_TABLET | ORAL | Status: DC | PRN
  Administered 2017-02-26: 5 mg via ORAL
  Filled 2017-02-25: qty 1

## 2017-02-25 MED ORDER — NALBUPHINE HCL 10 MG/ML IJ SOLN: 5.0000 mg | INTRAMUSCULAR | Status: DC | PRN

## 2017-02-25 MED ORDER — PHENYLEPHRINE 40 MCG/ML (10ML) SYRINGE FOR IV PUSH (FOR BLOOD PRESSURE SUPPORT)
80.0000 ug | PREFILLED_SYRINGE | INTRAVENOUS | Status: DC | PRN
  Administered 2017-02-25: 80 ug via INTRAVENOUS

## 2017-02-25 MED ORDER — CEFAZOLIN SODIUM-DEXTROSE 2-4 GM/100ML-% IV SOLN
2.0000 g | Freq: Three times a day (TID) | INTRAVENOUS | Status: DC
  Administered 2017-02-25: 2 g via INTRAVENOUS
  Filled 2017-02-25: qty 100

## 2017-02-25 MED ORDER — ONDANSETRON HCL 4 MG/2ML IJ SOLN
INTRAMUSCULAR | Status: DC | PRN
  Administered 2017-02-25: 4 mg via INTRAVENOUS

## 2017-02-25 MED ORDER — SIMETHICONE 80 MG PO CHEW: 80.0000 mg | CHEWABLE_TABLET | ORAL | Status: DC | PRN

## 2017-02-25 MED ORDER — DEXAMETHASONE SODIUM PHOSPHATE 10 MG/ML IJ SOLN
INTRAMUSCULAR | Status: DC | PRN
  Administered 2017-02-25: 10 mg via INTRAVENOUS

## 2017-02-25 MED ORDER — SODIUM BICARBONATE 8.4 % IV SOLN
INTRAVENOUS | Status: DC | PRN
  Administered 2017-02-25: 7 mL via EPIDURAL
  Administered 2017-02-25: 10 mL via EPIDURAL

## 2017-02-25 MED ORDER — IBUPROFEN 600 MG PO TABS
600.0000 mg | ORAL_TABLET | Freq: Four times a day (QID) | ORAL | Status: DC
  Administered 2017-02-26 – 2017-02-28 (×8): 600 mg via ORAL
  Filled 2017-02-25 (×7): qty 1

## 2017-02-25 MED ORDER — SCOPOLAMINE 1 MG/3DAYS TD PT72
MEDICATED_PATCH | TRANSDERMAL | Status: DC | PRN
  Administered 2017-02-25: 1 via TRANSDERMAL

## 2017-02-25 MED ORDER — PHENYLEPHRINE 40 MCG/ML (10ML) SYRINGE FOR IV PUSH (FOR BLOOD PRESSURE SUPPORT)
PREFILLED_SYRINGE | INTRAVENOUS | Status: AC
  Filled 2017-02-25: qty 20

## 2017-02-25 MED ORDER — LACTATED RINGERS IV SOLN: 500.0000 mL | Freq: Once | INTRAVENOUS | Status: DC

## 2017-02-25 MED ORDER — NALOXONE HCL 0.4 MG/ML IJ SOLN
1.0000 ug/kg/h | INTRAVENOUS | Status: DC | PRN
  Filled 2017-02-25: qty 5

## 2017-02-25 MED ORDER — ACETAMINOPHEN 325 MG PO TABS: 650.0000 mg | ORAL_TABLET | ORAL | Status: DC | PRN

## 2017-02-25 MED ORDER — KETOROLAC TROMETHAMINE 30 MG/ML IJ SOLN
30.0000 mg | Freq: Four times a day (QID) | INTRAMUSCULAR | Status: AC | PRN
Start: 2017-02-25 — End: 2017-02-26

## 2017-02-25 MED ORDER — FENTANYL 2.5 MCG/ML BUPIVACAINE 1/10 % EPIDURAL INFUSION (WH - ANES)
INTRAMUSCULAR | Status: AC
  Filled 2017-02-25: qty 100

## 2017-02-25 MED ORDER — DIPHENHYDRAMINE HCL 25 MG PO CAPS: 25.0000 mg | ORAL_CAPSULE | ORAL | Status: DC | PRN

## 2017-02-25 MED ORDER — COCONUT OIL OIL: 1.0000 "application " | TOPICAL_OIL | Status: DC | PRN

## 2017-02-25 MED ORDER — KETOROLAC TROMETHAMINE 30 MG/ML IJ SOLN
30.0000 mg | Freq: Once | INTRAMUSCULAR | Status: DC | PRN
Start: 2017-02-25 — End: 2017-02-25

## 2017-02-25 MED ORDER — DIPHENHYDRAMINE HCL 50 MG/ML IJ SOLN: 12.5000 mg | INTRAMUSCULAR | Status: DC | PRN

## 2017-02-25 MED ORDER — SODIUM BICARBONATE 8.4 % IV SOLN
INTRAVENOUS | Status: AC
  Filled 2017-02-25: qty 50

## 2017-02-25 MED ORDER — DEXTROSE 5 % IV SOLN
500.0000 mg | Freq: Once | INTRAVENOUS | Status: AC
  Administered 2017-02-25: 500 mg via INTRAVENOUS
  Filled 2017-02-25: qty 500

## 2017-02-25 MED ORDER — MEPERIDINE HCL 25 MG/ML IJ SOLN: 6.2500 mg | INTRAMUSCULAR | Status: DC | PRN

## 2017-02-25 MED ORDER — KETOROLAC TROMETHAMINE 30 MG/ML IJ SOLN
INTRAMUSCULAR | Status: AC
  Administered 2017-02-26: 30 mg via INTRAVENOUS
  Filled 2017-02-25: qty 1

## 2017-02-25 MED ORDER — MORPHINE SULFATE (PF) 0.5 MG/ML IJ SOLN
INTRAMUSCULAR | Status: DC | PRN
  Administered 2017-02-25: 3 mg via EPIDURAL

## 2017-02-25 MED ORDER — FENTANYL 2.5 MCG/ML BUPIVACAINE 1/10 % EPIDURAL INFUSION (WH - ANES)
14.0000 mL/h | INTRAMUSCULAR | Status: DC | PRN
  Administered 2017-02-25: 14 mL/h via EPIDURAL

## 2017-02-25 MED ORDER — SODIUM CHLORIDE 0.9% FLUSH: 3.0000 mL | INTRAVENOUS | Status: DC | PRN

## 2017-02-25 MED ORDER — SIMETHICONE 80 MG PO CHEW
80.0000 mg | CHEWABLE_TABLET | ORAL | Status: DC
  Administered 2017-02-26 – 2017-02-27 (×3): 80 mg via ORAL
  Filled 2017-02-25 (×3): qty 1

## 2017-02-25 MED ORDER — LACTATED RINGERS IV SOLN
INTRAVENOUS | Status: DC
  Administered 2017-02-25 – 2017-02-26 (×2): via INTRAVENOUS

## 2017-02-25 MED ORDER — ONDANSETRON HCL 4 MG/2ML IJ SOLN
INTRAMUSCULAR | Status: AC
  Filled 2017-02-25: qty 2

## 2017-02-25 MED ORDER — DIPHENHYDRAMINE HCL 25 MG PO CAPS: 25.0000 mg | ORAL_CAPSULE | Freq: Four times a day (QID) | ORAL | Status: DC | PRN

## 2017-02-25 MED ORDER — SIMETHICONE 80 MG PO CHEW
80.0000 mg | CHEWABLE_TABLET | Freq: Three times a day (TID) | ORAL | Status: DC
  Administered 2017-02-26 – 2017-02-28 (×5): 80 mg via ORAL
  Filled 2017-02-25 (×5): qty 1

## 2017-02-25 MED ORDER — FENTANYL CITRATE (PF) 100 MCG/2ML IJ SOLN
INTRAMUSCULAR | Status: AC
  Administered 2017-02-25: 100 ug
  Filled 2017-02-25: qty 2

## 2017-02-25 MED ORDER — ONDANSETRON HCL 4 MG/2ML IJ SOLN
4.0000 mg | Freq: Three times a day (TID) | INTRAMUSCULAR | Status: DC | PRN
  Administered 2017-02-25: 4 mg via INTRAVENOUS
  Filled 2017-02-25: qty 2

## 2017-02-25 MED ORDER — OXYCODONE HCL 5 MG PO TABS: 10.0000 mg | ORAL_TABLET | ORAL | Status: DC | PRN

## 2017-02-25 MED ORDER — TETANUS-DIPHTH-ACELL PERTUSSIS 5-2.5-18.5 LF-MCG/0.5 IM SUSP: 0.5000 mL | Freq: Once | INTRAMUSCULAR | Status: DC

## 2017-02-25 MED ORDER — MORPHINE SULFATE (PF) 0.5 MG/ML IJ SOLN
INTRAMUSCULAR | Status: AC
  Filled 2017-02-25: qty 10

## 2017-02-25 MED ORDER — OXYTOCIN 10 UNIT/ML IJ SOLN
INTRAMUSCULAR | Status: AC
  Filled 2017-02-25: qty 4

## 2017-02-25 MED ORDER — FENTANYL CITRATE (PF) 100 MCG/2ML IJ SOLN
INTRAMUSCULAR | Status: AC
  Filled 2017-02-25: qty 2

## 2017-02-25 MED ORDER — NALOXONE HCL 0.4 MG/ML IJ SOLN: 0.4000 mg | INTRAMUSCULAR | Status: DC | PRN

## 2017-02-25 MED ORDER — LACTATED RINGERS IV SOLN
INTRAVENOUS | Status: DC | PRN
  Administered 2017-02-25 (×2): via INTRAVENOUS

## 2017-02-25 MED ORDER — DIBUCAINE 1 % RE OINT: 1.0000 "application " | TOPICAL_OINTMENT | RECTAL | Status: DC | PRN

## 2017-02-25 MED ORDER — KETOROLAC TROMETHAMINE 30 MG/ML IJ SOLN
30.0000 mg | Freq: Four times a day (QID) | INTRAMUSCULAR | Status: AC
  Administered 2017-02-26 (×3): 30 mg via INTRAVENOUS
  Filled 2017-02-25 (×3): qty 1

## 2017-02-25 MED ORDER — FENTANYL CITRATE (PF) 100 MCG/2ML IJ SOLN: 100.0000 ug | INTRAMUSCULAR | Status: DC | PRN

## 2017-02-25 MED ORDER — IBUPROFEN 600 MG PO TABS: 600.0000 mg | ORAL_TABLET | Freq: Four times a day (QID) | ORAL | Status: DC

## 2017-02-25 MED ORDER — OXYTOCIN 10 UNIT/ML IJ SOLN
INTRAVENOUS | Status: DC | PRN
  Administered 2017-02-25: 40 [IU] via INTRAVENOUS

## 2017-02-25 MED ORDER — LACTATED RINGERS IV SOLN
INTRAVENOUS | Status: DC
  Administered 2017-02-25: 10:00:00 via INTRAUTERINE
  Administered 2017-02-25: 300 mL via INTRAUTERINE

## 2017-02-25 MED ORDER — LACTATED RINGERS IV SOLN
INTRAVENOUS | Status: DC | PRN
  Administered 2017-02-25: 12:00:00 via INTRAVENOUS

## 2017-02-25 MED ORDER — HYDROMORPHONE HCL 1 MG/ML IJ SOLN
INTRAMUSCULAR | Status: AC
Start: 2017-02-25 — End: 2017-02-26
  Filled 2017-02-25: qty 0.5

## 2017-02-25 MED ORDER — SODIUM CHLORIDE 0.9 % IR SOLN
Status: DC | PRN
  Administered 2017-02-25: 1000 mL

## 2017-02-25 SURGICAL SUPPLY — 34 items
BENZOIN TINCTURE PRP APPL 2/3 (GAUZE/BANDAGES/DRESSINGS) ×3 IMPLANT
CHLORAPREP W/TINT 26ML (MISCELLANEOUS) ×3 IMPLANT
CLAMP CORD UMBIL (MISCELLANEOUS) IMPLANT
CLOSURE WOUND 1/2 X4 (GAUZE/BANDAGES/DRESSINGS) ×1
CLOTH BEACON ORANGE TIMEOUT ST (SAFETY) ×3 IMPLANT
DRSG OPSITE POSTOP 4X10 (GAUZE/BANDAGES/DRESSINGS) ×3 IMPLANT
ELECT REM PT RETURN 9FT ADLT (ELECTROSURGICAL) ×3
ELECTRODE REM PT RTRN 9FT ADLT (ELECTROSURGICAL) ×1 IMPLANT
EXTRACTOR VACUUM M CUP 4 TUBE (SUCTIONS) IMPLANT
EXTRACTOR VACUUM M CUP 4' TUBE (SUCTIONS)
GLOVE BIOGEL PI IND STRL 7.0 (GLOVE) ×2 IMPLANT
GLOVE BIOGEL PI IND STRL 7.5 (GLOVE) ×2 IMPLANT
GLOVE BIOGEL PI INDICATOR 7.0 (GLOVE) ×4
GLOVE BIOGEL PI INDICATOR 7.5 (GLOVE) ×4
GLOVE ECLIPSE 7.5 STRL STRAW (GLOVE) ×3 IMPLANT
GOWN STRL REUS W/TWL LRG LVL3 (GOWN DISPOSABLE) ×9 IMPLANT
KIT ABG SYR 3ML LUER SLIP (SYRINGE) IMPLANT
NEEDLE HYPO 25X5/8 SAFETYGLIDE (NEEDLE) IMPLANT
NS IRRIG 1000ML POUR BTL (IV SOLUTION) ×3 IMPLANT
PACK C SECTION WH (CUSTOM PROCEDURE TRAY) ×3 IMPLANT
PAD OB MATERNITY 4.3X12.25 (PERSONAL CARE ITEMS) ×3 IMPLANT
PENCIL SMOKE EVAC W/HOLSTER (ELECTROSURGICAL) ×3 IMPLANT
RTRCTR C-SECT PINK 25CM LRG (MISCELLANEOUS) ×3 IMPLANT
SPONGE LAP 18X18 X RAY DECT (DISPOSABLE) ×3 IMPLANT
STRIP CLOSURE SKIN 1/2X4 (GAUZE/BANDAGES/DRESSINGS) ×2 IMPLANT
SUT PLAIN 2 0 (SUTURE) ×2
SUT PLAIN ABS 2-0 CT1 27XMFL (SUTURE) ×1 IMPLANT
SUT VIC AB 0 CTX 36 (SUTURE) ×8
SUT VIC AB 0 CTX36XBRD ANBCTRL (SUTURE) ×4 IMPLANT
SUT VIC AB 2-0 CT1 27 (SUTURE) ×2
SUT VIC AB 2-0 CT1 TAPERPNT 27 (SUTURE) ×1 IMPLANT
SUT VIC AB 4-0 KS 27 (SUTURE) ×3 IMPLANT
TOWEL OR 17X24 6PK STRL BLUE (TOWEL DISPOSABLE) ×3 IMPLANT
TRAY FOLEY BAG SILVER LF 14FR (SET/KITS/TRAYS/PACK) ×3 IMPLANT

## 2017-02-25 NOTE — Anesthesia Postprocedure Evaluation (Signed)
Anesthesia Post Note  Patient: Aimee Lopez  Procedure(s) Performed: CESAREAN SECTION (N/A )     Patient location during evaluation: Mother Baby Anesthesia Type: Epidural Level of consciousness: awake and alert and oriented Pain management: satisfactory to patient Vital Signs Assessment: post-procedure vital signs reviewed and stable Respiratory status: respiratory function stable Cardiovascular status: stable Postop Assessment: no headache, no backache, epidural receding, patient able to bend at knees, no signs of nausea or vomiting and adequate PO intake Anesthetic complications: no    Last Vitals:  Vitals:   02/25/17 1525 02/25/17 1627  BP: 137/73 125/64  Pulse: 98 88  Resp: 18 18  Temp: 37.2 C 36.9 C  SpO2: 97% 97%    Last Pain:  Vitals:   02/25/17 1627  TempSrc: Oral  PainSc: 0-No pain   Pain Goal: Patients Stated Pain Goal: 10 (02/24/17 2031)               Karleen DolphinFUSSELL,Tycho Cheramie

## 2017-02-25 NOTE — Anesthesia Preprocedure Evaluation (Signed)
Anesthesia Evaluation  Patient identified by MRN, date of birth, ID band Patient awake    Reviewed: Allergy & Precautions, H&P , NPO status , Patient's Chart, lab work & pertinent test results  Airway Mallampati: II  TM Distance: >3 FB Neck ROM: full    Dental no notable dental hx. (+) Teeth Intact   Pulmonary former smoker,    Pulmonary exam normal breath sounds clear to auscultation       Cardiovascular negative cardio ROS Normal cardiovascular exam Rhythm:regular Rate:Normal     Neuro/Psych negative neurological ROS  negative psych ROS   GI/Hepatic negative GI ROS, Neg liver ROS,   Endo/Other    Renal/GU negative Renal ROS     Musculoskeletal   Abdominal (+) + obese,   Peds  Hematology negative hematology ROS (+)   Anesthesia Other Findings   Reproductive/Obstetrics (+) Pregnancy                             Anesthesia Physical Anesthesia Plan  ASA: II  Anesthesia Plan: Epidural   Post-op Pain Management:    Induction:   PONV Risk Score and Plan:   Airway Management Planned:   Additional Equipment:   Intra-op Plan:   Post-operative Plan:   Informed Consent: I have reviewed the patients History and Physical, chart, labs and discussed the procedure including the risks, benefits and alternatives for the proposed anesthesia with the patient or authorized representative who has indicated his/her understanding and acceptance.     Plan Discussed with:   Anesthesia Plan Comments:         Anesthesia Quick Evaluation

## 2017-02-25 NOTE — Progress Notes (Signed)
Attempted to ambulate patient in room, but patient unable to stand at this time d/t being nauseated and dizzy.  Tried again in an hour and patient still states "too sick to get up" right now. Zofran given, will continue to monitor.

## 2017-02-25 NOTE — Progress Notes (Signed)
Patient ID: Aimee OilerGloria Ruiz Lopez, female   DOB: Jan 31, 1996, 21 y.o.   MRN: [id number]  Called to patient's room. Prolonged decel with brief return to baseline, then return to bradycardia. Cervix unchanged.   The risks of cesarean section discussed with the patient included but were not limited to: bleeding which may require transfusion or reoperation; infection which may require antibiotics; injury to bowel, bladder, ureters or other surrounding organs; injury to the fetus; need for additional procedures including hysterectomy in the event of a life-threatening hemorrhage; placental abnormalities wth subsequent pregnancies, incisional problems, thromboembolic phenomenon and other postoperative/anesthesia complications. The patient concurred with the proposed plan, giving informed written consent for the procedure.   Patient has been NPO since last night she will remain NPO for procedure. Anesthesia and OR aware.  Preoperative prophylactic Ancef ordered on call to the OR.  To OR when ready.  Levie HeritageStinson, Hridhaan Yohn J, DO 02/25/2017 10:59 AM

## 2017-02-25 NOTE — Progress Notes (Signed)
Labor Progress Note Aimee OilerGloria Ruiz Lopez is a 21 y.o. G2P0010 at 4068w1d presented for IOL for A1GDM S: Nurse called about frequent contractions (q561min) and variable decelerations. No patient complaints  O:  BP (!) 120/57   Pulse (!) 112   Temp 98.4 F (36.9 C) (Oral)   Resp 19   Ht 5\' 2"  (1.575 m)   Wt 202 lb (91.6 kg)   LMP 05/20/2016   BMI 36.95 kg/m  EFM: 150 bpm/mod var/no decels/pos acels  CVE: Dilation: 5.5 Effacement (%): 80 Cervical Position: Posterior Station: -1 Presentation: Vertex Exam by:: amwalker,rn   A&P: 21 y.o. G2P0010 5268w1d here for IOL for A1GDM #Labor: turned down pitocin gradually and still had frequent contractions and variables. Stopped pitocin, given terbutaline x1, and start amnioinfusion. EFM now cat 1 with these measures. Continue to monitor.  #FWB: cat 1   Rolm BookbinderAmber Pama Roskos, DO 6:37 AM

## 2017-02-25 NOTE — Progress Notes (Signed)
Labor Progress Note Aimee OilerGloria Ruiz Lopez is a 21 y.o. G2P0010 at 776w1d presented for IOL for A1GDM. S: No complaints  O:  BP 121/72   Pulse 75   Temp 98.4 F (36.9 C) (Oral)   Resp 19   Ht 5\' 2"  (1.575 m)   Wt 202 lb (91.6 kg)   LMP 05/20/2016   BMI 36.95 kg/m  EFM: 130 bpm/mod var/pos acels/no decels  CVE: Dilation: 4 Effacement (%): 70 Cervical Position: Posterior Station: -2 Presentation: Vertex Exam by:: Mary SwazilandJordan Johnson, Rn    A&P: 21 y.o. G2P0010 1776w1d here for IOL for A1GDM. #Labor: continue pitocin. Currently at 12 Mu/min #FWB: cat 1  Rahkeem Senft, DO 3:05 AM

## 2017-02-25 NOTE — Progress Notes (Signed)
AROM @ 0340, clear small amount of fluid. IUPC placed. Continue to increase pitocin until adequate contractions. Cervix: 5/50/-1 EFM: 120 bpm/mod var/pos acels/early decels- cat 1  Lovella Hardie, DO

## 2017-02-25 NOTE — Progress Notes (Signed)
Admitted pt to unit with interpreter Aimee Lopez. Pt understood instructions and had no further questions at this time.

## 2017-02-25 NOTE — Progress Notes (Signed)
Pt communicated with via in-house interpreter and [employer] interpreter [phone]. Communicated with patient about checking cervix and restarting pitocin and patient had no other questions.

## 2017-02-25 NOTE — Transfer of Care (Signed)
Immediate Anesthesia Transfer of Care Note  Patient: Aimee OilerGloria Ruiz Lopez  Procedure(s) Performed: CESAREAN SECTION (N/A )  Patient Location: PACU  Anesthesia Type:Epidural  Level of Consciousness: awake, alert  and patient cooperative  Airway & Oxygen Therapy: Patient Spontanous Breathing  Post-op Assessment: Report given to RN and Post -op Vital signs reviewed and stable  Post vital signs: Reviewed and stable  Last Vitals:  Vitals:   02/25/17 1001 02/25/17 1030  BP: 129/74 (!) 147/80  Pulse: 89 (!) 104  Resp:    Temp:    SpO2:  100%    Last Pain:  Vitals:   02/25/17 0830  TempSrc:   PainSc: 5       Patients Stated Pain Goal: 10 (02/24/17 2031)  Complications: No apparent anesthesia complications

## 2017-02-25 NOTE — Anesthesia Procedure Notes (Signed)
Epidural Patient location during procedure: OB Start time: 02/25/2017 9:12 AM End time: 02/25/2017 9:16 AM  Staffing Anesthesiologist: Leilani AbleHatchett, Rylyn Ranganathan, MD Performed: anesthesiologist   Preanesthetic Checklist Completed: patient identified, site marked, surgical consent, pre-op evaluation, timeout performed, IV checked, risks and benefits discussed and monitors and equipment checked  Epidural Patient position: sitting Prep: site prepped and draped and DuraPrep Patient monitoring: continuous pulse ox and blood pressure Approach: midline Location: L3-L4 Injection technique: LOR air  Needle:  Needle type: Tuohy  Needle gauge: 17 G Needle length: 9 cm and 9 Needle insertion depth: 5 cm cm Catheter type: closed end flexible Catheter size: 19 Gauge Catheter at skin depth: 10 cm Test dose: negative and Other  Assessment Sensory level: T9 Events: blood not aspirated, injection not painful, no injection resistance, negative IV test and no paresthesia  Additional Notes Reason for block:procedure for pain

## 2017-02-25 NOTE — Op Note (Signed)
Chrystine OilerGloria Ruiz Guzman PROCEDURE DATE: 02/25/2017  PREOPERATIVE DIAGNOSIS: Intrauterine pregnancy at  5238w1d weeks gestation; non-reassuring fetal status  POSTOPERATIVE DIAGNOSIS: The same  PROCEDURE: Priamry Low Transverse Cesarean Section  SURGEON:  Dr. Candelaria CelesteJacob Stinson  ASSISTANT: Dr. Caryl AdaJazma Phelps  INDICATIONS: Chrystine OilerGloria Ruiz Guzman is a 21 y.o. G2P1011 at 2938w1d for primary cesarean section secondary to non-reassuring fetal status.  The risks of cesarean section discussed with the patient included but were not limited to: bleeding which may require transfusion or reoperation; infection which may require antibiotics; injury to bowel, bladder, ureters or other surrounding organs; injury to the fetus; need for additional procedures including hysterectomy in the event of a life-threatening hemorrhage; placental abnormalities wth subsequent pregnancies, incisional problems, thromboembolic phenomenon and other postoperative/anesthesia complications. The patient concurred with the proposed plan, giving informed written consent for the procedure.    FINDINGS:  Viable female infant in cephalic presentation.  Apgars 9 and 9.  Meconium stained amniotic fluid.  Intact placenta, three vessel cord.  Normal uterus, fallopian tubes and ovaries bilaterally.  ANESTHESIA: Epidural INTRAVENOUS FLUIDS: 2700 ml ESTIMATED BLOOD LOSS: 1100 ml URINE OUTPUT:  600 ml SPECIMENS: Placenta sent to L&D COMPLICATIONS: None immediate  PROCEDURE IN DETAIL:  The patient received intravenous antibiotics and had sequential compression devices applied to her lower extremities. She was then taken to the operating room where epidural anesthesia was dosed up to surgical level and was found to be adequate. She was then placed in a dorsal supine position with a leftward tilt, and prepped and draped in a sterile manner.  A foley catheter was placed into her bladder and attached to constant gravity, which drained clear fluid throughout.  After an  adequate timeout was performed, a Pfannenstiel skin incision was made with scalpel and carried through to the underlying layer of fascia. The fascia was incised in the midline and this incision was extended bilaterally using the Mayo scissors. Kocher clamps were applied to the superior aspect of the fascial incision and the underlying rectus muscles were dissected off bluntly. A similar process was carried out on the inferior aspect of the facial incision. The rectus muscles were separated in the midline bluntly and the peritoneum was entered bluntly. An Alexis retractor was placed to aid in visualization of the uterus.  Attention was turned to the lower uterine segment where a transverse hysterotomy was made with a scalpel and extended bilaterally bluntly. The infant was successfully delivered, and cord was clamped and cut and infant was handed over to awaiting neonatology team. Uterine massage was then administered and the placenta delivered intact with three-vessel cord. The uterus was then cleared of clot and debris.  The hysterotomy was closed with 0 Vicryl in a running locked fashion, and an imbricating layer was also placed with a 0 Vicryl. Overall, excellent hemostasis was noted. The abdomen and the pelvis were cleared of all clot and debris and the Jon Gillslexis was removed. Hemostasis was confirmed on all surfaces.  The peritoneum was reapproximated using 2-0 vicryl running stitches. The fascia was then closed using 0 Vicryl in a running fashion. The subcutaneous layer was reapproximated with plain gut and the skin was closed with 4-0 vicryl. The patient tolerated the procedure well. Sponge, lap, instrument and needle counts were correct x 2. She was taken to the recovery room in stable condition.    Pincus Largehelps, Jazma Y, DO 02/25/2017 12:15 PM OB Fellow

## 2017-02-25 NOTE — Anesthesia Postprocedure Evaluation (Signed)
Anesthesia Post Note  Patient: Aimee OilerGloria Ruiz Lopez  Procedure(s) Performed: CESAREAN SECTION (N/A )     Patient location during evaluation: PACU Anesthesia Type: Epidural Level of consciousness: awake Pain management: pain level controlled Vital Signs Assessment: post-procedure vital signs reviewed and stable Respiratory status: spontaneous breathing Cardiovascular status: stable Postop Assessment: no headache, no backache, epidural receding, patient able to bend at knees and no apparent nausea or vomiting Anesthetic complications: no    Last Vitals:  Vitals:   02/25/17 1300 02/25/17 1315  BP: 121/66 116/66  Pulse: 98 95  Resp: (!) 22 16  Temp:    SpO2: 99% 99%    Last Pain:  Vitals:   02/25/17 1315  TempSrc:   PainSc: 0-No pain   Pain Goal: Patients Stated Pain Goal: 10 (02/24/17 2031)               Antino Mayabb JR,JOHN Susann GivensFRANKLIN

## 2017-02-25 NOTE — Addendum Note (Signed)
Addendum  created 02/25/17 1715 by Graciela HusbandsFussell, Reagan Klemz O, CRNA   Sign clinical note

## 2017-02-26 LAB — CBC
HCT: 28.2 % — ABNORMAL LOW (ref 36.0–46.0)
Hemoglobin: 9.3 g/dL — ABNORMAL LOW (ref 12.0–15.0)
MCH: 29.3 pg (ref 26.0–34.0)
MCHC: 33 g/dL (ref 30.0–36.0)
MCV: 89 fL (ref 78.0–100.0)
PLATELETS: 180 10*3/uL (ref 150–400)
RBC: 3.17 MIL/uL — AB (ref 3.87–5.11)
RDW: 14.7 % (ref 11.5–15.5)
WBC: 15.7 10*3/uL — ABNORMAL HIGH (ref 4.0–10.5)

## 2017-02-26 MED ORDER — FERROUS SULFATE 325 (65 FE) MG PO TABS
325.0000 mg | ORAL_TABLET | Freq: Two times a day (BID) | ORAL | Status: DC
  Administered 2017-02-26 – 2017-02-28 (×5): 325 mg via ORAL
  Filled 2017-02-26 (×5): qty 1

## 2017-02-26 NOTE — Progress Notes (Signed)
CSW acknowledges consult.  Consult screened out; a score of greater than 9/yes to question 10 on Edinburgh Postpartum Depression Screen warrants a CSW consult.   Makinzy Cleere Boyd-Gilyard, MSW, LCSW Clinical Social Work [phone] 

## 2017-02-26 NOTE — Progress Notes (Signed)
Subjective: Postpartum Day 1: Cesarean Delivery Patient reports drinking, ambulating well. Ate small amt food last night. Hasn't voided yet- catheter only out a few hours. Some dizziness last night, but better this am. +flatus.  Lochia and pain wnl.  Denies current dizziness, lightheadedness, or sob. No complaints.   Objective: Vital signs in last 24 hours: Temp:  [98.1 F (36.7 C)-99.9 F (37.7 C)] 98.6 F (37 C) (11/21 0559) Pulse Rate:  [67-122] 67 (11/21 0559) Resp:  [16-22] 18 (11/21 0559) BP: (101-150)/(48-135) 107/50 (11/21 0559) SpO2:  [94 %-100 %] 100 % (11/21 0559)   I/O last 3 completed shifts: In: 2700 [I.V.:2700] Out: 4400 [Urine:3300; Blood:1100] No intake/output data recorded.   Physical Exam:  General: alert, cooperative and no distress Lochia: appropriate Uterine Fundus: firm Incision: bulky dressing intact w/o drainage, to remove in shower today DVT Evaluation: No evidence of DVT seen on physical exam. Negative Homan's sign. No cords or calf tenderness. No significant calf/ankle edema.  Recent Labs    02/24/17 1248 02/26/17 0548  HGB 13.9 9.3*  HCT 41.5 28.2*    Assessment/Plan: Status post Cesarean section. Doing well postoperatively.  Continue to push po fluids and eat today. Try again to void within next 2 hours.  Add Fe BID Breast and bottlefeeding, no circumcision, plans nexplanon Continue current care.  Aimee Lopez, Aimee Lopez 02/26/2017, 7:36 AM

## 2017-02-26 NOTE — Progress Notes (Signed)
Check on patients need, ordered meals, by Orlan LeavensViria Alvarez Spanish Interpreter

## 2017-02-27 DIAGNOSIS — O9081 Anemia of the puerperium: Secondary | ICD-10-CM

## 2017-02-27 NOTE — Lactation Note (Signed)
This note was copied from a baby's chart. Lactation Consultation Note Baby 37 hrs old. Spanish speaking mom w/interpreter Aimee Lopez [phone]#[id number].  Mom is formula feeding. Mom states she will breast and formula when her milk comes in, now doesn't have any supply. Hand expressed colostrum from tight breast flat nipple to small cone shaped breast. Hand expressed showing colostrum. Mom states she doesn't feel well. Baby in nursery d/t crying, baby very gassy per CNRN. Baby drinking Gerber.  Mom has hand pump, mom stated she pumped nothing come. Explained colostrum thick, needs for stimulation.  Mom stated she didn't feel well, was very cold. Rm. Very warm, mom's skin reddened, moist, wearing toboggan. Mom stated her skin feels hot. LC stated because it was hot in rm, and mom wearing toboggan. Mom stated she was very cold that's why she put it on. LC will report to RN. Discussed w/mom wearing shells to evert her nipples to get a deep latch. Asked mom for her NS to assess for fit and to observe mom application. Mom stated she doesn't know what happen to it. It was on bedside table. LC asked mom if she wants to BF when she goes home, mom stated maybe for a little while. Informed mom that LC and Rn are here to help her BF which is great for the baby, mom tell LC what are your plans and desire to feed your baby. Mom stated right now formula. LEAD reviewed, supply and demand.  Asked mom what she was going to do when her milk comes in, mom states she will probably breast feed and formula feed.    Patient Name: Aimee Lopez Today's Date: 02/27/2017 Reason for consult: Initial assessment   Maternal Data Has patient been taught Hand Expression?: Yes Does the patient have breastfeeding experience prior to this delivery?: No  Feeding    LATCH Score       Type of Nipple: Flat  Comfort (Breast/Nipple): Soft / non-tender        Interventions Interventions: Breast feeding basics reviewed;Breast  compression;Shells;Breast massage;Support pillows;Hand pump;Hand express;Position options;Pre-pump if needed  Lactation Tools Discussed/Used Tools: Shells;Pump Shell Type: Inverted Breast pump type: Manual WIC Program: Yes Pump Review: Setup, frequency, and cleaning;Milk Storage Initiated by:: RN Date initiated:: 02/25/17   Consult Status Consult Status: Follow-up Date: 02/28/17 Follow-up type: In-patient    Aimee Lopez, Dodie Parisi G 02/27/2017, 12:37 AM

## 2017-02-27 NOTE — Plan of Care (Signed)
  Progressing Clinical Measurements: Ability to maintain clinical measurements within normal limits will improve 02/27/2017 1843 - Progressing by Claudette LawsBurgess, Zimri Brennen D, RN Will remain free from infection 02/27/2017 1843 - Progressing by Princess PernaBurgess, Brittaney Beaulieu D, RN Respiratory complications will improve 02/27/2017 1843 - Progressing by Claudette LawsBurgess, Collyn Selk D, RN Activity: Risk for activity intolerance will decrease 02/27/2017 1843 - Progressing by Princess PernaBurgess, Caeleb Batalla D, RN Nutrition: Adequate nutrition will be maintained 02/27/2017 1843 - Progressing by Claudette LawsBurgess, Shiquita Collignon D, RN Coping: Level of anxiety will decrease 02/27/2017 1843 - Progressing by Princess PernaBurgess, Kleigh Hoelzer D, RN Elimination: Will not experience complications related to bowel motility 02/27/2017 1843 - Progressing by Princess PernaBurgess, Shealynn Saulnier D, RN Pain Managment: General experience of comfort will improve 02/27/2017 1843 - Progressing by Princess PernaBurgess, Lynnsey Barbara D, RN Note Pt pain has been managed with scheduled Motrin.  Expectations discussed re: pain free vs tolerable level of pain.  Educated with interpreter. Safety: Ability to remain free from injury will improve 02/27/2017 1843 - Progressing by Claudette LawsBurgess, Nola Botkins D, RN Skin Integrity: Risk for impaired skin integrity will decrease 02/27/2017 1843 - Progressing by Princess PernaBurgess, Pranay Hilbun D, RN Activity: Ability to tolerate increased activity will improve 02/27/2017 1843 - Progressing by Princess PernaBurgess, Tab Rylee D, RN Note Pt encouraged to ambulate in hallway.  Ambulates in room without difficulty & tolerates well. Coping: Ability to identify and utilize available resources and services will improve 02/27/2017 1843 - Progressing by Princess PernaBurgess, Sonnet Rizor D, RN Note Interpreter used for teaching.  Life Cycle: Chance of risk for complications during the postpartum period will decrease 02/27/2017 1843 - Progressing by Claudette LawsBurgess, Ann Bohne D, RN Role Relationship: Ability to demonstrate positive interaction with newborn will improve 02/27/2017 1843  - Progressing by Claudette LawsBurgess, Paxtyn Boyar D, RN Skin Integrity: Demonstration of wound healing without infection will improve 02/27/2017 1843 - Progressing by Princess PernaBurgess, Logon Uttech D, RN

## 2017-02-27 NOTE — Progress Notes (Signed)
POSTPARTUM PROGRESS NOTE  POD# 2  Subjective:  Aimee Lopez is a 21 y.o. G2P1011 s/p pLTCS at 4543w1d.  No acute events overnight.  Pt denies problems with ambulating, voiding or po intake.  She denies nausea or vomiting.  Pain is well controlled.  She has had flatus.  Lochia Small.   Objective: Blood pressure 120/64, pulse (!) 103, temperature (!) 100.6 F (38.1 C), temperature source Oral, resp. rate 18, height 5\' 2"  (1.575 m), weight 202 lb (91.6 kg), last menstrual period 05/20/2016, SpO2 99 %, unknown if currently breastfeeding.  Physical Exam:  General: alert, cooperative and no distress Chest: no respiratory distress Heart:regular rate, distal pulses intact Abdomen: soft, nontender,  Uterine Fundus: firm, appropriately tender DVT Evaluation: No calf swelling or tenderness Extremities: no edema Skin: warm, dry; incision clean/dry/intact with honey comb dressing inpalced  Recent Labs    02/24/17 1248 02/26/17 0548  HGB 13.9 9.3*  HCT 41.5 28.2*    Assessment/Plan: Aimee Lopez is a 21 y.o. G2P1011 s/p pLTCS for HRFHT at 3643w1d   POD#1 - Doing well Contraception: breast and bottle Feeding: Nexplanon Anemia: Ferrous Sulfate BID A1GDM: will need 2-hr GTT 4-6 weeks PP Language barrier: [place] interpreter Derek Mound(Ricardo) used via [employer] Dispo: Plan for discharge tomorrow   LOS: 3 days   Kandra NicolasJulie P DegeleMD 02/27/2017, 12:09 PM

## 2017-02-28 MED ORDER — OXYCODONE HCL 5 MG PO TABS: 5.0000 mg | ORAL_TABLET | ORAL | 0 refills | Status: DC | PRN

## 2017-02-28 NOTE — Discharge Summary (Signed)
OB Discharge Summary     Patient Name: Aimee OilerGloria Ruiz Guzman DOB: [birth year] MRN: [id number]  Date of admission: 02/24/2017 Delivering MD: Levie HeritageSTINSON, JACOB J   Date of discharge: 02/28/2017  Admitting diagnosis: INDUCTION Intrauterine pregnancy: 1718w1d     Secondary diagnosis:  Principal Problem:   Status post primary low transverse cesarean section Active Problems:   Gestational diabetes mellitus   Encounter for induction of labor   Anemia, postpartum  Additional problems:      Discharge diagnosis: GDM A1, Anemia and LTCS                                                                                                Post partum procedures:n/a  Augmentation: Pitocin  Complications: None  Hospital course:  Induction of Labor With Cesarean Section  21 y.o. yo G2P1011 at 2418w1d was admitted to the hospital 02/24/2017 for induction of labor. Patient had a labor course significant for prolonged decel. The patient went for cesarean section due to Non-Reassuring FHR, and delivered a Viable infant,. Details of operation can be found in separate operative Note.  Patient had an uncomplicated postpartum course. She is ambulating, tolerating a regular diet, passing flatus, and urinating well.  Patient is discharged home in stable condition on 02/28/17.                                    Physical exam  Vitals:   02/26/17 1804 02/27/17 0541 02/27/17 1834 02/28/17 0527  BP: (!) 123/58 120/64 (!) 105/57 (!) 108/57  Pulse: 60 (!) 103 87 67  Resp: 18 18 18 18   Temp: 99.1 F (37.3 C) (!) 100.6 F (38.1 C) 98.3 F (36.8 C) 97.8 F (36.6 C)  TempSrc: Oral Oral Oral Oral  SpO2: 99%     Weight:      Height:       General: alert, cooperative and no distress Lochia: appropriate Uterine Fundus: firm Incision: Healing well with no significant drainage DVT Evaluation: No evidence of DVT seen on physical exam. Labs: Lab Results  Component Value Date   WBC 15.7 (H) 02/26/2017   HGB 9.3 (L)  02/26/2017   HCT 28.2 (L) 02/26/2017   MCV 89.0 02/26/2017   PLT 180 02/26/2017   CMP Latest Ref Rng & Units 02/16/2017  Glucose 65 - 99 mg/dL 77  BUN 6 - 20 mg/dL 11  Creatinine 4.090.44 - 8.111.00 mg/dL 9.140.54  Sodium 782135 - 956145 mmol/L 134(L)  Potassium 3.5 - 5.1 mmol/L 3.4(L)  Chloride 101 - 111 mmol/L 104  CO2 22 - 32 mmol/L 21(L)  Calcium 8.9 - 10.3 mg/dL 9.0  Total Protein 6.5 - 8.1 g/dL 7.0  Total Bilirubin 0.3 - 1.2 mg/dL 0.3  Alkaline Phos 38 - 126 U/L 194(H)  AST 15 - 41 U/L 22  ALT 14 - 54 U/L 16    Discharge instruction: per After Visit Summary and "Baby and Me Booklet".  After visit meds:  Allergies as of 02/28/2017   No Known Allergies  Medication List    TAKE these medications   ACCU-CHEK FASTCLIX LANCETS Misc 1 Device by Percutaneous route 4 (four) times daily.   glucose blood test strip Commonly known as:  ACCU-CHEK GUIDE Use as instructed   oxyCODONE 5 MG immediate release tablet Commonly known as:  Oxy IR/ROXICODONE Take 1 tablet (5 mg total) by mouth every 4 (four) hours as needed for up to 10 doses (pain scale 4-7).   PREPLUS 27-1 MG Tabs Take 1 tablet by mouth daily.       Diet: routine diet  Activity: Advance as tolerated. Pelvic rest for 6 weeks.   Outpatient follow up:2 weeks Follow up Appt: Future Appointments  Date Time Provider Department Center  04/07/2017  8:40 AM Dorathy KinsmanSmith, [place] [facility]   Follow up Visit:No Follow-up on file.  Postpartum contraception: Nexplanon  Newborn Data: Live born child  Birth Weight: 7 lb 8.8 oz (3425 g) APGAR: 8, 9  Newborn Delivery   Birth date/time:  02/25/2017 11:33:00 Delivery type:  C-Section, Low Transverse C-section categorization:  Primary     Baby Feeding: Bottle and Breast Disposition:home with mother   02/28/2017 Marthenia RollingScott Boyd Litaker, DO

## 2017-02-28 NOTE — Lactation Note (Signed)
This note was copied from a baby's chart. Lactation Consultation Note  Patient Name: Aimee Lopez ZOXWR'UToday's Date: 02/28/2017 Reason for consult: Follow-up assessment   Baby 70 hours old and Eda interpreter present for Spanish Baby on phototherapy. Family member trying to give baby a bottle of breastmilk but baby is refusing. Offered to change diaper. Swaddled infant w/ lights and baby fell asleep. Mother has been pumped approx 60 ml of breastmilk per 3 hours. Mother denies questions or concerns.    Maternal Data    Feeding Feeding Type: Bottle Fed - Formula Nipple Type: Slow - flow  LATCH Score                   Interventions    Lactation Tools Discussed/Used     Consult Status Consult Status: Follow-up Date: 03/01/17 Follow-up type: In-patient    Dahlia ByesBerkelhammer, Ruth [facility] HospitalBoschen 02/28/2017, 10:06 AM

## 2017-03-01 ENCOUNTER — Ambulatory Visit: Payer: Self-pay

## 2017-03-01 NOTE — Lactation Note (Signed)
This note was copied from a baby's chart. Lactation Consultation Note  Patient Name: Aimee Chrystine OilerGloria Ruiz Guzman ZOXWR'UToday's Date: 03/01/2017 Reason for consult: Follow-up assessment;Other (Comment);Term(S/P triple Photo tx, D/C today @1030  am  , for D/C today . [name] - Spanish interpreter present )  Baby is 124 days old, and due to challenging latch and has been consistent with pumping around the clock and milk is in Bilaterally. Mom recently pumped over 100 ml. Denies engorgement.  Sore nipple and engorgement prevention and tx.   Per mom Active with WIC / [place], and [facility] paper work completed and $30.oo collected.  LC reviewed set up for D/C and explained to mom the [facility].  Mom receptive to returning for Lc O/P appt. LC  Placed a request in the O/P clinic basket in Epic.  Mother informed of post-discharge support and given phone number to the lactation department, including services for phone call assistance; out-patient appointments; and breastfeeding support group. List of other breastfeeding resources in the community given in the handout. Encouraged mother to call for problems or concerns related to breastfeeding.   Maternal Data    Feeding Feeding Type: Bottle Fed - Breast Milk Nipple Type: Slow - flow  LATCH Score                   Interventions Interventions: Breast feeding basics reviewed;DEBP  Lactation Tools Discussed/Used Tools: Pump;Shells Breast pump type: Double-Electric Breast Pump WIC Program: Yes Pump Review: Setup, frequency, and cleaning;Milk Storage Initiated by:: MAI / reviewed    Consult Status Consult Status: Follow-up([facility] aware she will be getting a call Monday - requested for WEdnesday ) Follow-up type: Out-patient    Matilde SprangMargaret Ann Aireanna Luellen 03/01/2017, 2:37 PM

## 2017-04-07 ENCOUNTER — Ambulatory Visit (INDEPENDENT_AMBULATORY_CARE_PROVIDER_SITE_OTHER): Payer: Self-pay | Admitting: Advanced Practice Midwife

## 2017-04-07 ENCOUNTER — Encounter: Payer: Self-pay | Admitting: Advanced Practice Midwife

## 2017-04-07 VITALS — BP 109/67 | HR 65 | Ht 60.75 in | Wt 181.9 lb

## 2017-04-07 DIAGNOSIS — N939 Abnormal uterine and vaginal bleeding, unspecified: Secondary | ICD-10-CM

## 2017-04-07 DIAGNOSIS — Z8632 Personal history of gestational diabetes: Secondary | ICD-10-CM

## 2017-04-07 DIAGNOSIS — Z30011 Encounter for initial prescription of contraceptive pills: Secondary | ICD-10-CM

## 2017-04-07 LAB — POCT URINALYSIS DIP (DEVICE)
BILIRUBIN URINE: NEGATIVE
Glucose, UA: NEGATIVE mg/dL
Ketones, ur: NEGATIVE mg/dL
Nitrite: NEGATIVE
PH: 5.5 (ref 5.0–8.0)
Protein, ur: 30 mg/dL — AB
Specific Gravity, Urine: 1.025 (ref 1.005–1.030)
Urobilinogen, UA: 0.2 mg/dL (ref 0.0–1.0)

## 2017-04-07 MED ORDER — NORETHINDRONE 0.35 MG PO TABS: 1.0000 | ORAL_TABLET | Freq: Every day | ORAL | 11 refills | Status: DC

## 2017-04-07 MED ORDER — MEDROXYPROGESTERONE ACETATE 10 MG PO TABS: 10.0000 mg | ORAL_TABLET | Freq: Every day | ORAL | 0 refills | Status: DC

## 2017-04-07 NOTE — Progress Notes (Signed)
Subjective:   Patient not fasting today, explained to her will need to come another day for lab only appt for fasting 2 hr gtt.   Aimee Lopez is a 21 y.o. female who presents for a postpartum visit. She is 5 [id number] postpartum following a low cervical transverse Cesarean section. I have fully reviewed the prenatal and intrapartum course. The delivery was at 40 gestational weeks. Outcome: primary cesarean section, low transverse incision. Anesthesia: epidural. Postpartum course has been Uncomplicated. Baby's course has been uncomplicated. Baby is feeding by both breast and bottle -  . Bleeding heavier than a normal period. Not soaking a pad an hour. States lochia stopped 1.5 weeks after delivery, but period started yesterday.  Bowel function is normal. Bladder function is normal. Patient is not sexually active. Contraception method is none. Postpartum depression screening: negative.  The following portions of the patient's history were reviewed and updated as appropriate: allergies, current medications, past family history, past medical history, past social history, past surgical history and problem list.  Review of Systems Pertinent items are noted in HPI.   Denies dizziness, fatigue, tachycardia.   Objective:    BP 109/67   Pulse 65   Ht 5' 0.75" (1.543 m)   Wt 181 lb 14.4 oz (82.5 kg)   LMP 05/20/2016   Breastfeeding? Yes   BMI 34.65 kg/m   General:  alert, cooperative and no distress   Breasts:  Declined  Lungs: clear to auscultation bilaterally  Heart:  regular rate and rhythm, S1, S2 normal, no murmur, click, rub or gallop  Abdomen: soft, non-tender; bowel sounds normal; no masses,  no organomegaly and Steristrips in place, removed. Incision healing well.    Pelvic:  declined        Assessment:   Postpartum exam complicated by menorrhagia. VSS, asymptomatic. Refused pelvic exam.  Bleeding not C/W PP Hemorrhage .  Pap smear not done at today's visit due to age less than  21.   Plan:    1. Provera x 10 days for Menorrhagia 2. Contraception: oral progesterone-only contraceptive until [facility]). Start POPs immediately after Provera.  3. Follow up in: 1 year or as needed.   4. Start Paps at age 21.  5. 2 hour GTT ASAP.  6. Call office or go to MAU for severe or symptomatic bleeding.  Katrinka BlazingSmith, [place], CNM 04/07/2017 10:08 AM

## 2017-04-07 NOTE — Patient Instructions (Signed)
Menorragia  Menorrhagia  La menorragia es una afección por la cual los períodos menstruales son muy abundantes o duran más de lo normal. La mayoría de los períodos de una mujer con menorragia pueden causar una pérdida de sangre abundante y cólicos abdominales que le impidan realizar sus actividades habituales.  ¿Cuáles son las causas?  Las causas más frecuentes de esta afección incluyen las siguientes:  · Formaciones no cancerosas en el útero (pólipos o fibromas).  · Un desequilibrio entre las hormonas estrógeno y progesterona.  · Uno de los ovarios no libera óvulos durante uno o más meses.  · Un problema con la glándula tiroidea (hipotiroidismo).  · Efectos secundarios por haberse colocado un dispositivo intrauterino (DIU).  · Efectos secundarios por algunos medicamentos, como antiinflamatorios o anticoagulantes.  · Un trastorno hemorrágico que impide la correcta coagulación.    En algunos casos, se desconoce la causa de este trastorno.  ¿Cuáles son los signos o los síntomas?  Los síntomas de esta afección incluyen lo siguiente:  · Tiene que cambiar el apósito o el tampón cada 1 o 2 horas debido a que está completamente empapado.  · Necesita usar apósitos y tampones al mismo tiempo porque pierde demasiada sangre.  · Debe levantarse para cambiarse el apósito o el tampón durante la noche.  · Elimina coágulos más grandes de 1 pulgada (2,5 cm).  · El sangrado dura más de 7 días.  · Tiene síntomas de niveles bajos de hierro (anemia), como cansancio, fatiga o falta de aire.    ¿Cómo se diagnostica?  Esta afección se puede diagnosticar en función de lo siguiente:  · Un examen físico.  · Sus síntomas y antecedentes menstruales.  · Estudios, por ejemplo:  ? Análisis de sangre para verificar si está embarazada o tiene cambios hormonales, trastornos de la tiroides o hemorrágicos, anemia, u otros problemas.  ? Prueba de Papanicolaou para verificar cambios malignos, infecciones o inflamación.  ? Biopsia de endometrio. Esta  prueba implica retirar una muestra de tejido de la pared del útero (endometrio) para examinarlo con microscopio.  ? Ecografía pélvica. Este estudio utiliza ondas de sonido para tomar imágenes del útero, los ovarios y la vagina. Las imágenes pueden mostrar si tiene fibromas u otros crecimientos.  ? Histeroscopia. Para este estudio se usa un pequeño telescopio para ver dentro de su útero.    ¿Cómo se trata?  Es posible que no se requiera tratamiento para esta afección. En caso de ser necesario, el mejor tratamiento para usted dependerá de lo siguiente:  · Si necesita evitar un embarazo.  · Si desea tener hijos en el futuro.  · La causa y la gravedad del sangrado.  · Su preferencia personal.    Los medicamentos son el primer paso en el tratamiento. Puede recibir alguno de los siguientes tratamientos:  · Métodos anticonceptivos hormonales. Estos tratamientos reducen el sangrado durante el período menstrual. Estos incluyen los siguientes:  ? Anticonceptivos orales.  ? Parche dérmico.  ? Anillo vaginal.  ? Inyecciones que recibe cada 3 meses.  ? DIU hormonal (dispositivo intrauterino).  ? Implantes que se colocan debajo de la piel.  · Medicamentos que espesan la sangre y hacen más lento el sangrado.  · Medicamentos que reducen la inflamación, como el ibuprofeno.  · Medicamentos que contienen una hormona artificial (sintética) llamada progestina.  · Medicamentos que hacen que los ovarios dejen de funcionar durante un breve lapso.  · Suplementos de hierro para tratar la anemia.    Si los medicamentos no resultan   eficaces, puede ser necesaria una cirugía. Algunas opciones quirúrgicas son las siguientes:  · Dilatación y curetaje (D y C). En este procedimiento, su médico abre (dilata) el cuello uterino y luego raspa o succiona tejido del endometrio para disminuir el sangrado menstrual.  · Histeroscopia quirúrgica. En este procedimiento, se utiliza un pequeño tubo con una luz en el extremo (histeroscopio) para observar el útero  y ayudar en la extirpación de un pólipo que puede ser la causa de períodos abundantes.  · Ablación del endometrio. Se usan varias técnicas para destruir permanentemente todo el endometrio. Luego de la ablación del endometrio, la mayoría de las mujeres tienen escaso flujo menstrual, o no lo tienen. Este procedimiento reduce la posibilidad de quedar embarazada.  · Resección del endometrio. En este procedimiento, se usa un asa de alambre electroquirúrgica para extirpar el endometrio. Este procedimiento reduce la posibilidad de quedar embarazada.  · Histerectomía. Es la remoción quirúrgica del útero. Es un procedimiento permanente que interrumpe los períodos menstruales. No es posible quedar embarazada luego de una histerectomía.    Siga estas instrucciones en su casa:  Medicamentos  · Tome los medicamentos recetados y de venta libre exactamente como se lo haya indicado su médico. Esto incluye píldoras de suplemento de hierro.  · No cambie ni reemplace los medicamentos sin consultarlo con su médico.  · No tome aspirina ni medicamentos que contengan aspirina desde 1 semana antes ni durante el período menstrual. La aspirina puede hacer que la hemorragia empeore.  Instrucciones generales  · Si necesita cambiar el apósito o el tampón más de una vez cada 2 horas, limite su actividad hasta que la hemorragia se detenga.  · Las píldoras de suplemento de hierro pueden causar estreñimiento. A fin de prevenir o tratar el estreñimiento mientras toma suplementos de hierro recetados, el médico puede recomendarle lo siguiente:  ? Beber suficiente líquido para mantener la orina clara o de color amarillo pálido.  ? Tomar medicamentos recetados o de venta libre.  ? Consumir alimentos ricos en fibra, como frutas y verduras frescas, cereales integrales y frijoles.  ? Limitar el consumo de alimentos con alto contenido de grasas y azúcares procesados, como alimentos fritos o dulces.  · Seguir una dieta balanceada, lo que incluye alimentos  con alto contenido de hierro. Entre estos alimentos se incluyen vegetales de hoja verde, carne, hígado, huevos y panes y cereales integrales.  · No trate de perder peso hasta que la hemorragia anormal se detenga y los niveles de hierro en la sangre vuelvan a la normalidad. Si debe perder peso, hable con su médico para hacerlo de manera segura.  · Concurra a todas las visitas de control como se lo haya indicado el médico. Esto es importante.  Comuníquese con un médico si:  · Empapa un tampón o un apósito cada 1 o 2 horas, y esto le ocurre cada vez que tiene el período.  · Necesita usar apósitos y tampones al mismo tiempo porque pierde demasiada sangre.  · Tiene náuseas, vómitos, diarrea u otros problemas relacionados con los medicamentos que está tomando.  Solicite ayuda de inmediato si:  · Empapa más de un apósito o tampón en 1 hora.  · Elimina coágulos más grandes que 1 pulgada (2,5 cm).  · Le falta el aire.  · Siente que el corazón late demasiado rápido.  · Se siente mareada o se desmaya.  · Se siente muy débil o cansada.  Resumen  · La menorragia es una afección por la cual los períodos menstruales son   fin reemplazar el consejo del mdico. Asegrese de hacerle al mdico cualquier pregunta que tenga. Document Released: 01/02/2005 Document Revised: 07/12/2016 Document Reviewed: 07/12/2016 Elsevier Interactive Patient Education  2018 [employer]    Etonogestrel implant Qu es este medicamento? El  ETONOGESTREL es un dispositivo anticonceptivo (control de la natalidad). Se [place] para evitar los embarazos. Se puede usar hasta por 3 aos. Este medicamento puede ser utilizado para otros usos; si tiene alguna pregunta consulte con su proveedor de atencin mdica o con su farmacutico. MARCAS COMUNES: Implanon, Nexplanon Qu le debo informar a mi profesional de la salud antes de tomar este medicamento? Necesita saber si usted presenta alguno de los siguientes problemas o situaciones: sangrado vaginal anormal enfermedad vascular o cogulos sanguneos cncer de mama, cervical, heptico depresin diabetes enfermedad de la vescula biliar dolores de cabeza enfermedad cardiaca o ataque cardiaco reciente alta presin sangunea alto nivel de colesterol enfermedad renal enfermedad heptica convulsiones fuma tabaco una reaccin alrgica o inusual al etonogestrel, otras hormonas, anestsicos o antispticos, medicamentos, alimentos, colorantes o conservantes si est embarazada o buscando quedar embarazada si est amamantando a un beb Cmo debo SLM Corporationutilizar este medicamento? Un profesional de la salud inserta este dispositivo justo debajo de la piel en la parte interior del brazo. Hable con su pediatra para informarse acerca del uso de este medicamento en nios. Puede requerir atencin especial. Sobredosis: Pngase en contacto inmediatamente con un centro toxicolgico o una sala de urgencia si usted cree que haya tomado demasiado medicamento. ATENCIN: Reynolds AmericanEste medicamento es solo para usted. No comparta este medicamento con nadie. Qu sucede si me olvido de una dosis? No se aplica en este caso. Qu puede interactuar con este medicamento? No tome este medicamento con ninguno de los siguientes frmacos: amprenavir bosentano fosamprenavir Este medicamento tambin podra interactuar con los siguientes medicamentos: barbitricos para inducir el sueo o para el tratamiento de convulsiones ciertos medicamentos para  infecciones micticas, tales como itraconazol y Associate Professorketoconazol jugo de toronja griseofulvina medicamentos para tratar convulsiones, tales como Dyesscarbamazepina, [place]felbamato, Neoshooxcarbazepina, Ackerlyfenitona, topiramato modafinilo fenilbutazona rifampicina rufinamida algunos medicamentos para tratar la infeccin por el VIH, tales como atazanavir, indinavir, lopinavir, nelfinavir, tipranavir, ritonavir hierba de [address] [place] Puede ser que esta lista no menciona todas las posibles interacciones. Informe a su profesional de Beazer Homesla salud de [employer] Inctodos los productos a base de hierbas, medicamentos de [place] libre o suplementos nutritivos que est tomando. Si usted fuma, consume bebidas alcohlicas o si utiliza drogas ilegales, indqueselo tambin a su profesional de Beazer Homesla salud. Algunas sustancias pueden interactuar con su medicamento. A qu debo estar atento al usar PPL Corporationeste medicamento? Este producto no protege contra la infeccin por el VIH (SIDA) u otras enfermedades de transmisin sexual. Usted debe sentir el implante al presionar con las yemas de los dedos sobre la piel donde se insert. Contacte a su mdico si no se siente el implante y [place] un mtodo anticonceptivo no hormonal (como el condn) hasta que el mdico confirma que el implante est en su Environmental consultantlugar. Si siente que el implante puede haber roto o doblado en su brazo, pngase en contacto con su proveedor de atencin mdica. Qu efectos secundarios puedo tener al Boston Scientificutilizar este medicamento? Efectos secundarios que debe informar a su mdico o a Producer, television/film/videosu profesional de la salud tan pronto como sea posible: Therapist, artreacciones alrgicas, como erupcin cutnea, picazn o urticarias, e hinchazn de la cara, los labios o la lengua bultos en las mamas cambios en las emociones o el estado de nimo estado de nimo deprimido sangrado menstrual  intenso o Software engineerprolongado dolor, irritacin, hinchazn o Counsellormoretones en el lugar de la insercin Immunologistcicatriz en el lugar de la insercin signos de infeccin en el sitio de insercin tales  Engineer, maintenance (IT)como fiebre, y enrojecimiento de la piel, Engineer, miningdolor o secrecin signos de Psychiatristembarazo signos y sntomas de un cogulo sanguneo, tales como problemas respiratorios; cambios en la visin; dolor en el pecho; dolor de cabeza severo, repentino; dolor, hinchazn, calor en la pierna; dificultad para hablar; entumecimiento o debilidad repentina de la cara, el brazo o la pierna signos y sntomas de lesin al hgado, como orina amarilla oscura o [place]; sensacin general de estar enfermo o sntomas gripales; heces claras; prdida de apetito; nuseas; dolor en la regin abdominal superior derecha; cansancio o debilidad inusual; color amarillento de los ojos o la piel sangrado vaginal inusual, secrecin signos y sntomas de un derrame cerebral, tales como cambios en la visin; confusin; dificultad para hablar o entender; dolores de cabeza severos; entumecimiento o debilidad repentina de la cara, el brazo o la pierna; problemas al Advertising account plannercaminar; Psychiatristmareo; prdida del equilibrio o la coordinacin Efectos secundarios que generalmente no requieren Psychologist, prison and probation servicesatencin mdica (infrmelos a su mdico o a Producer, television/film/videosu profesional de la salud si persisten o si son molestos): acn dolor de Retail buyerespalda dolor en las mamas cambios de peso mareos sensacin general de estar enfermo o sntomas gripales dolor de cabeza sangrado menstrual irregular nuseas dolor de garganta irritacin o inflamacin vaginal Puede ser que esta lista no menciona todos los posibles efectos secundarios. Comunquese a su mdico por asesoramiento mdico Hewlett-Packardsobre los efectos secundarios. Usted puede informar los efectos secundarios a la FDA por telfono al 1-800-FDA-1088. Dnde debo guardar mi medicina? Este medicamento se administra en hospitales o clnicas y no necesitar guardarlo en su domicilio. ATENCIN: Este folleto es un resumen. Puede ser que no cubra toda la posible informacin. Si usted tiene preguntas acerca de esta medicina, consulte con su mdico, su farmacutico o su profesional de Dietitianla  salud.  2018 Elsevier/Gold Standard (2016-04-25 00:00:00)

## 2017-04-14 ENCOUNTER — Other Ambulatory Visit: Payer: Self-pay

## 2017-06-10 ENCOUNTER — Telehealth: Payer: Self-pay | Admitting: General Practice

## 2017-06-10 NOTE — Telephone Encounter (Signed)
Patient called and left message on nurse line stating she needs someone to call her back. Called patient, no answer- left message stating we are trying to reach you to return your phone call, please call us back if you still need assistance

## 2017-09-11 ENCOUNTER — Other Ambulatory Visit: Payer: Self-pay

## 2017-09-11 ENCOUNTER — Encounter (HOSPITAL_COMMUNITY): Payer: Self-pay

## 2017-09-11 ENCOUNTER — Emergency Department (HOSPITAL_COMMUNITY)
Admission: EM | Admit: 2017-09-11 | Discharge: 2017-09-11 | Disposition: A | Discharge status: Home / Self Care | Payer: Medicaid Other | Attending: Emergency Medicine | Admitting: Emergency Medicine

## 2017-09-11 DIAGNOSIS — J020 Streptococcal pharyngitis: Secondary | ICD-10-CM | POA: Diagnosis not present

## 2017-09-11 DIAGNOSIS — Z87891 Personal history of nicotine dependence: Secondary | ICD-10-CM | POA: Diagnosis not present

## 2017-09-11 DIAGNOSIS — Z79899 Other long term (current) drug therapy: Secondary | ICD-10-CM | POA: Insufficient documentation

## 2017-09-11 DIAGNOSIS — L0201 Cutaneous abscess of face: Secondary | ICD-10-CM | POA: Insufficient documentation

## 2017-09-11 DIAGNOSIS — R220 Localized swelling, mass and lump, head: Secondary | ICD-10-CM | POA: Diagnosis present

## 2017-09-11 LAB — GROUP A STREP BY PCR: Group A Strep by PCR: DETECTED — AB

## 2017-09-11 MED ORDER — DEXAMETHASONE SODIUM PHOSPHATE 10 MG/ML IJ SOLN
10.0000 mg | Freq: Once | INTRAMUSCULAR | Status: AC
  Administered 2017-09-11: 10 mg via INTRAMUSCULAR
  Filled 2017-09-11: qty 1

## 2017-09-11 MED ORDER — CLINDAMYCIN HCL 150 MG PO CAPS: 450.0000 mg | ORAL_CAPSULE | Freq: Four times a day (QID) | ORAL | 0 refills | Status: AC

## 2017-09-11 NOTE — ED Provider Notes (Signed)
[facility] EMERGENCY DEPARTMENT Provider Note   CSN: [id number] Arrival date & time: 09/11/17  1158     History   Chief Complaint Chief Complaint  Patient presents with  . Abscess    HPI Aimee Lopez is a 22 y.o. female here for evaluation of right-sided facial swelling onset 2 days ago.  Associated symptoms include mild redness, tenderness.  Pain is worse with direct palpation.  Over the last 2 days she is also noticed sore throat and noticed that her tonsils were swollen.  Reports long history of recurrent throat infections, was once recommended tonsillectomy but was unable to follow-up.  She thinks that she has another throat infection.  She denies fevers, chills, trismus, changes in her voice, sick contacts.  No interventions PTA.  HPI  Past Medical History:  Diagnosis Date  . Medical history non-contributory     Patient Active Problem List   Diagnosis Date Noted  . Status post primary low transverse cesarean section 02/25/2017  . History of gestational diabetes mellitus 08/21/2016    Past Surgical History:  Procedure Laterality Date  . CESAREAN SECTION N/A 02/25/2017   Procedure: CESAREAN SECTION;  Surgeon: Levie Heritage, DO;  Location: [facility] BIRTHING SUITES;  Service: Obstetrics;  Laterality: N/A;  . NO PAST SURGERIES       OB History    Gravida  2   Para  1   Term  1   Preterm  0   AB  1   Living  1     SAB  1   TAB  0   Ectopic  0   Multiple  0   Live Births  1            Home Medications    Prior to Admission medications   Medication Sig Start Date End Date Taking? Authorizing Provider  ACCU-CHEK FASTCLIX LANCETS MISC 1 Device by Percutaneous route 4 (four) times daily. Patient not taking: Reported on 04/07/2017 08/21/16   Levie Heritage, DO  clindamycin (CLEOCIN) 150 MG capsule Take 3 capsules (450 mg total) by mouth every 6 (six) hours for 3 days. 09/11/17 09/14/17  Liberty Handy, PA-C  glucose blood  (ACCU-CHEK GUIDE) test strip Use as instructed Patient not taking: Reported on 04/07/2017 08/21/16   Levie Heritage, DO  medroxyPROGESTERone (PROVERA) 10 MG tablet Take 1 tablet (10 mg total) by mouth daily. Use for ten days 04/07/17   Katrinka Blazing, [place], CNM  norethindrone (MICRONOR,CAMILA,ERRIN) 0.35 MG tablet Take 1 tablet (0.35 mg total) by mouth daily. 04/07/17   Katrinka Blazing, [place], CNM  oxyCODONE (OXY IR/ROXICODONE) 5 MG immediate release tablet Take 1 tablet (5 mg total) by mouth every 4 (four) hours as needed for up to 10 doses (pain scale 4-7). Patient not taking: Reported on 04/07/2017 02/28/17   Marthenia Rolling, DO  Prenatal Vit-Fe Fumarate-FA (PREPLUS) 27-1 MG TABS Take 1 tablet by mouth daily. 11/28/16   Lesly Dukes, MD    Family History Family History  Problem Relation Age of Onset  . Diabetes Mother   . Diabetes Father   . Diabetes Maternal Aunt     Social History Social History   Tobacco Use  . Smoking status: Former Smoker    Last attempt to quit: 06/17/2016    Years since quitting: 1.2  . Smokeless tobacco: Never Used  Substance Use Topics  . Alcohol use: No  . Drug use: Yes    Types: Marijuana    Comment:  Denies use since delivery 04/07/17     Allergies   Patient has no known allergies.   Review of Systems Review of Systems  HENT: Positive for facial swelling and sore throat.   All other systems reviewed and are negative.    Physical Exam Updated Vital Signs BP 131/72   Pulse 81   Temp 98.2 F (36.8 C)   Resp 16   Ht 5\' 1"  (1.549 m)   Wt 86.2 kg (190 lb)   LMP 08/17/2017   SpO2 100%   BMI 35.90 kg/m   Physical Exam  Constitutional: She is oriented to person, place, and time. She appears well-developed and well-nourished.  Non-toxic appearance.  HENT:  Head: Normocephalic.  Right Ear: External ear normal.  Left Ear: External ear normal.  Nose: Nose normal.  Small area of edema, tenderness to right lower jaw, there is focal area of  induration/fluctuance approx 1x1 cm in the center expressing scant amount of watery/white drainage.  Moderate symmetric tonsillar hypertrophy bilaterally, both tonsils are touching uvula, bilateral exudates noted. No trismus, hot potato voice, SL edema or tenderness.   Eyes: Conjunctivae and EOM are normal.  Neck: Full passive range of motion without pain.  No anterior neck tenderness or asymmetric edema. Full PROM of neck without pain.   Cardiovascular: Normal rate.  Pulmonary/Chest: Effort normal. No tachypnea. No respiratory distress.  Musculoskeletal: Normal range of motion.  Neurological: She is alert and oriented to person, place, and time.  Skin: Skin is warm and dry. Capillary refill takes less than 2 seconds.  Psychiatric: Her behavior is normal. Thought content normal.       ED Treatments / Results  Labs (all labs ordered are listed, but only abnormal results are displayed) Labs Reviewed  GROUP A STREP BY PCR - Abnormal; Notable for the following components:      Result Value   Group A Strep by PCR DETECTED (*)    All other components within normal limits    EKG None  Radiology No results found.  Procedures Procedures (including critical care time)  Medications Ordered in ED Medications  dexamethasone (DECADRON) injection 10 mg (has no administration in time range)     Initial Impression / Assessment and Plan / ED Course  I have reviewed the triage vital signs and the nursing notes.  Pertinent labs & imaging results that were available during my care of the patient were reviewed by me and considered in my medical decision making (see chart for details).  Clinical Course as of Sep 11 1501  Thu Sep 11, 2017  1421 Group A Strep by PCR(!): DETECTED [CG]    Clinical Course User Index [CG] Liberty Handy, PA-C    Right facial edema likely from cystic acne this is actively draining clear/white drainage on exam and has mild local edema.  No expansion into  periorbital area or intraoral cavity.  Rapid strep positive confirm rapid strep which fits clinical picture.  Will treat for small facial abscess and strep pharyngitis with clindamycin, warm compress/massage, warm liquids, NSAIDs. Decadron IM given in ER. Pt tolerating secretions and PO. No signs of PTA, deep neck or facial abscess, ludwig's.  Return precautions given. Pt in agreement.   Final Clinical Impressions(s) / ED Diagnoses   Final diagnoses:  Strep pharyngitis  Facial abscess    ED Discharge Orders        Ordered    clindamycin (CLEOCIN) 150 MG capsule  Every 6 hours     09/11/17  1501       Liberty HandyGibbons, Jarid Sasso J, PA-C 09/11/17 1503    Lorre NickAllen, Anthony, MD 09/13/17 1146

## 2017-09-11 NOTE — ED Triage Notes (Signed)
Pt states that she has had swelling in her right mouth X2 days.

## 2017-09-11 NOTE — Discharge Instructions (Addendum)
You have bacterial infection of your cysts throat called strep pharyngitis.  The swelling to the right side of your face is most likely from a small but deep abscess or collection of pus.  We will treat both of these infections with clindamycin for 10 days.  Take ibuprofen (Advil) and/or acetaminophen (Tylenol) every 8 hours for inflammation and pain.  Warm water and massage to the facial swelling will help improve drainage.  Return to the ER for worsening facial swelling, fevers, chills, difficulty swallowing, changes to your voice, difficulty opening her jaw.  Usted tiene una infeccin bacteriana de su quiste de garganta llamada faringitis estreptoccica. La hinchazn en el lado derecho de su cara es ms probable debido a un absceso pequeo o profundo o una acumulacin de pus. Trataremos ambas infecciones con clindamicina por 10 das. Tome ibuprofeno (Advil) y / o acetaminofeno (Tylenol) cada 8 horas para la inflamacin y Chief Technology Officerel dolor. Agua tibia y masajes en la hinchazn facial ayudarn a mejorar el drenaje. Regrese a la sala de emergencias si nota que empeorar la hinchazn facial, fiebres, escalofros, dificultad para comer o pasar la saliva, cambios en su voz, dificultad para abrir Architectural technologistla mandbula.

## 2017-11-10 ENCOUNTER — Emergency Department (HOSPITAL_COMMUNITY): Payer: Medicaid Other

## 2017-11-10 ENCOUNTER — Other Ambulatory Visit: Payer: Self-pay

## 2017-11-10 ENCOUNTER — Telehealth: Payer: Self-pay

## 2017-11-10 ENCOUNTER — Inpatient Hospital Stay (HOSPITAL_COMMUNITY)
Admission: EM | Admit: 2017-11-10 | Discharge: 2017-11-12 | DRG: 419 | Disposition: A | Discharge status: Home / Self Care | Payer: Medicaid Other | Attending: Family Medicine | Admitting: Family Medicine

## 2017-11-10 ENCOUNTER — Encounter (HOSPITAL_COMMUNITY): Payer: Self-pay | Admitting: Emergency Medicine

## 2017-11-10 DIAGNOSIS — K8000 Calculus of gallbladder with acute cholecystitis without obstruction: Secondary | ICD-10-CM

## 2017-11-10 DIAGNOSIS — R1011 Right upper quadrant pain: Secondary | ICD-10-CM

## 2017-11-10 DIAGNOSIS — R1010 Upper abdominal pain, unspecified: Secondary | ICD-10-CM

## 2017-11-10 DIAGNOSIS — K8070 Calculus of gallbladder and bile duct without cholecystitis without obstruction: Secondary | ICD-10-CM

## 2017-11-10 DIAGNOSIS — R7401 Elevation of levels of liver transaminase levels: Secondary | ICD-10-CM

## 2017-11-10 DIAGNOSIS — R7309 Other abnormal glucose: Secondary | ICD-10-CM

## 2017-11-10 DIAGNOSIS — R945 Abnormal results of liver function studies: Principal | ICD-10-CM

## 2017-11-10 DIAGNOSIS — K8062 Calculus of gallbladder and bile duct with acute cholecystitis without obstruction: Principal | ICD-10-CM | POA: Diagnosis present

## 2017-11-10 DIAGNOSIS — R740 Nonspecific elevation of levels of transaminase and lactic acid dehydrogenase [LDH]: Secondary | ICD-10-CM | POA: Diagnosis present

## 2017-11-10 DIAGNOSIS — K219 Gastro-esophageal reflux disease without esophagitis: Secondary | ICD-10-CM | POA: Diagnosis present

## 2017-11-10 DIAGNOSIS — R7989 Other specified abnormal findings of blood chemistry: Secondary | ICD-10-CM

## 2017-11-10 DIAGNOSIS — R112 Nausea with vomiting, unspecified: Secondary | ICD-10-CM | POA: Diagnosis not present

## 2017-11-10 DIAGNOSIS — F17210 Nicotine dependence, cigarettes, uncomplicated: Secondary | ICD-10-CM | POA: Diagnosis present

## 2017-11-10 DIAGNOSIS — R109 Unspecified abdominal pain: Secondary | ICD-10-CM | POA: Diagnosis present

## 2017-11-10 LAB — CBC
HEMATOCRIT: 45.3 % (ref 36.0–46.0)
Hemoglobin: 14.9 g/dL (ref 12.0–15.0)
MCH: 30.1 pg (ref 26.0–34.0)
MCHC: 32.9 g/dL (ref 30.0–36.0)
MCV: 91.5 fL (ref 78.0–100.0)
Platelets: 315 10*3/uL (ref 150–400)
RBC: 4.95 MIL/uL (ref 3.87–5.11)
RDW: 13.5 % (ref 11.5–15.5)
WBC: 8.9 10*3/uL (ref 4.0–10.5)

## 2017-11-10 LAB — URINALYSIS, ROUTINE W REFLEX MICROSCOPIC
Bilirubin Urine: NEGATIVE
GLUCOSE, UA: NEGATIVE mg/dL
Ketones, ur: 5 mg/dL — AB
LEUKOCYTES UA: NEGATIVE
NITRITE: NEGATIVE
PH: 5 (ref 5.0–8.0)
PROTEIN: 30 mg/dL — AB
SPECIFIC GRAVITY, URINE: 1.031 — AB (ref 1.005–1.030)

## 2017-11-10 LAB — I-STAT BETA HCG BLOOD, ED (MC, WL, AP ONLY): I-stat hCG, quantitative: <5 m[IU]/mL (ref <5)

## 2017-11-10 LAB — LIPASE, BLOOD: Lipase: 43 U/L (ref 11–51)

## 2017-11-10 LAB — HEMOGLOBIN A1C
Hgb A1c MFr Bld: 5.1 % (ref 4.8–5.6)
Mean Plasma Glucose: 99.67 mg/dL

## 2017-11-10 LAB — COMPREHENSIVE METABOLIC PANEL
ALK PHOS: 116 U/L (ref 38–126)
ALT: 459 U/L — ABNORMAL HIGH (ref 0–44)
ANION GAP: 11 (ref 5–15)
AST: 488 U/L — AB (ref 15–41)
Albumin: 3.9 g/dL (ref 3.5–5.0)
BILIRUBIN TOTAL: 2.2 mg/dL — AB (ref 0.3–1.2)
BUN: 8 mg/dL (ref 6–20)
CALCIUM: 9.4 mg/dL (ref 8.9–10.3)
CO2: 22 mmol/L (ref 22–32)
Chloride: 107 mmol/L (ref 98–111)
Creatinine, Ser: 0.64 mg/dL (ref 0.44–1.00)
GFR calc Af Amer: >60 mL/min (ref >60)
GFR calc non Af Amer: >60 mL/min (ref >60)
Glucose, Bld: 130 mg/dL — ABNORMAL HIGH (ref 70–99)
POTASSIUM: 3.6 mmol/L (ref 3.5–5.1)
Sodium: 140 mmol/L (ref 135–145)
TOTAL PROTEIN: 7.7 g/dL (ref 6.5–8.1)

## 2017-11-10 LAB — PROTIME-INR
INR: 1.17
PROTHROMBIN TIME: 14.8 s (ref 11.4–15.2)

## 2017-11-10 LAB — ACETAMINOPHEN LEVEL

## 2017-11-10 MED ORDER — MORPHINE SULFATE (PF) 4 MG/ML IV SOLN
4.0000 mg | Freq: Once | INTRAVENOUS | Status: AC
  Administered 2017-11-10: 4 mg via INTRAVENOUS
  Filled 2017-11-10: qty 1

## 2017-11-10 MED ORDER — MORPHINE SULFATE (PF) 4 MG/ML IV SOLN
4.0000 mg | INTRAVENOUS | Status: DC | PRN
  Administered 2017-11-11 (×2): 4 mg via INTRAVENOUS
  Filled 2017-11-10 (×2): qty 1

## 2017-11-10 MED ORDER — ONDANSETRON HCL 4 MG PO TABS: 4.0000 mg | ORAL_TABLET | Freq: Three times a day (TID) | ORAL | 0 refills | Status: DC | PRN

## 2017-11-10 MED ORDER — ACETAMINOPHEN 650 MG RE SUPP: 650.0000 mg | Freq: Four times a day (QID) | RECTAL | Status: DC | PRN

## 2017-11-10 MED ORDER — SODIUM CHLORIDE 0.9 % IV BOLUS
1000.0000 mL | Freq: Once | INTRAVENOUS | Status: AC
  Administered 2017-11-10: 1000 mL via INTRAVENOUS

## 2017-11-10 MED ORDER — ONDANSETRON HCL 4 MG/2ML IJ SOLN
4.0000 mg | Freq: Once | INTRAMUSCULAR | Status: AC
  Administered 2017-11-10: 4 mg via INTRAVENOUS
  Filled 2017-11-10: qty 2

## 2017-11-10 MED ORDER — IOHEXOL 300 MG/ML  SOLN
100.0000 mL | Freq: Once | INTRAMUSCULAR | Status: AC | PRN
  Administered 2017-11-10: 100 mL via INTRAVENOUS

## 2017-11-10 MED ORDER — SODIUM CHLORIDE 0.9 % IV SOLN
INTRAVENOUS | Status: DC
  Administered 2017-11-10 – 2017-11-11 (×2): via INTRAVENOUS

## 2017-11-10 MED ORDER — ACETAMINOPHEN 325 MG PO TABS: 650.0000 mg | ORAL_TABLET | Freq: Four times a day (QID) | ORAL | Status: DC | PRN

## 2017-11-10 MED ORDER — HEPARIN SODIUM (PORCINE) 5000 UNIT/ML IJ SOLN
5000.0000 [IU] | Freq: Three times a day (TID) | INTRAMUSCULAR | Status: DC
  Administered 2017-11-10 – 2017-11-11 (×2): 5000 [IU] via SUBCUTANEOUS
  Filled 2017-11-10 (×2): qty 1

## 2017-11-10 NOTE — H&P (Addendum)
Family Medicine Teaching [facility] Admission History and Physical Service Pager: [phone]  Patient name: Aimee Lopez Medical record number: [id number] Date of birth: November 11, 1995 Age: 22 y.o. Gender: female  Primary Care Provider: Lynann Bologna, MD Consultants: GI Code Status: full  Chief Complaint: Abdominal pain  Assessment and Plan: Aimee Lopez is a 22 y.o. female presenting with RUQ abdominal pain. PMH is significant for gestational diabetes and previous C-section.  Abdominal pain Patient stated that her pain began shortly after a meal with greasy food.  Patient reported that pain appeared to worsen with intake of greasy food.  Pain is primarily in the right upper quadrant though also present in the epigastrium left upper quadrant, similar to the pain of birthing contractions.  Patient also had 3 episodes of nonbloody vomiting.   On admission vitals were unremarkable and admission labs were remarkable for AST: 488, ALT: 459, T bili: 2.2, lipase: 43, WBCs: 8.9. Abdominal ultrasound showed one 4 mm stone with no evidence of acute cholecystitis and negative Sonographer Murphy's sign.  CT abdomen showed gallbladder wall enhancement and mild edema without stones suspicious for cholecystitis in addition to heterogenous attenuation throughout the liver suspicious for hepatitis, although nonspecific.  Pancreatitis seems unlikely due to low lipase levels.  GERD does not explain many of her lab findings.  Uterine and ovarian abnormalities are unlikely due to the location of the pain and normal appearance of these organs on CT abdomen. Other possibilities include viral hepatitis, unclear travel history. Prior history of C-section in 02/2017 with no other abdominal surgeries, bowel movement changes, or obstruction visualized on CT. The most likely cause of her right upper abdominal pain at this time is cholecystitis vs choledocolithiasis given elevated LFTs. -Admit to MedSurg, attending  Dr. Jennette Lopez -Monitor vitals, up with assistance -GI consulted in ED, appreciate recs - Surgery consulted in ED, appreciate recs -Hepatitis panel pending -Continue to monitor LFTs and liver enzymes - IV morphine 4mg  q4h PRN - PO/PR tylenol for fever - s/p 1L NS bolus in ED, NS @100ml /hr - am CBC, CMP - NPO - vitals per floor routine  H/o GDM Patient reports having gestational diabetes and taking oral medication at that time although per chart review appears diet controlled.  Patient has not taken any medication for diabetes since her pregnancy.  Patient does report increased thirst and increased urinary frequency.  Her strong family history of diabetes suggest that she is a high risk for developing herself. Last A1c: 5.6 (01/01/2017).  Blood glucose on admission was 130. Given pt is likely in a fasting state, CBG>126, h/o GDM and strong family history of diabetes, will check A1c. 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 Suspect related to environmental or chemical allergens contributing to symptoms of SOB, sore throat, congestion given largely benign HEENT and lung exam. Enlarged adenoids present on exam but without exudate or significant erythema. Previous h/o strep throat in 09/2017 s/p treatment with Clindamycin although no cervical lymphandenopathy, making her Centor criteria 1 and no indication for further testing or antibiotics. Respirations stable on room air with appropriate saturations. - monitor for symptoms  FEN/GI: NPO Prophylaxis: heparin  Disposition: admit to MedSurg for further eval and treatment, attending Dr. Jennette Lopez  History of Present Illness:  Aimee Lopez is a 22 y.o. female presenting with 1 day  of right upper quadrant pain.Previous medical history is significant for  gestational diabetes and cesarean section delivery.  Patient reports that her right upper quadrant pain started last night about 2 hours after meal of pupusas and tamales.  Her stomach pain has persisted throughout the night.  Pain is described as a 6 out of 10 pain in her right upper quadrant and sometimes move moves lower in her abdomen and middle chest and feels similar to birth contractions, that worsens with the intake of greasy foods.  She has also had some nausea overnight in addition to 3 episodes of vomiting without blood.  No changes in bowel movements.  No one else is sick at home.  She reports thirst recently in addition to polyuria without dysuria.  Patient has a history significant for gestational diabetes for which she reports she took oral glycemic control agents.  She has not taken any medication for diabetes since her pregnancy.  She is a family history of diabetes and her mother and aunt in both maternal grandparents.  On review of systems Aimee Lopez also noted sinus congestion with a productive cough with green sputum and throat pain.  She also noted shortness of breath on occasion unrelated to activity which she experiences at home and at work.  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  The past week she has experienced multiple tension headaches described as generalized pulsatile pain around her whole head without light sensitivity.  Reports smoking about 3 cigarettes/day and drinks 5-6 beers on the weekend.  She denies cocaine and heroin use ever.  She is currently active and uses condoms during every intercourse.  Had pLTCS with delivery of first baby 02/2017, no other abdominal surgeries.    Review Of Systems: Per HPI with the following additions:   Review of Systems  Constitutional: Negative for fever.  HENT: Positive for congestion and sore throat.   Respiratory: Positive for cough, sputum production and shortness of breath.  Negative for hemoptysis.   Cardiovascular: Positive for chest pain. Negative for palpitations.  Gastrointestinal: Positive for abdominal pain and vomiting. Negative for blood in stool, constipation and diarrhea.  Genitourinary: Positive for frequency. Negative for dysuria.  Neurological: Positive for headaches. Negative for tremors.    Patient Active Problem List   Diagnosis Date Noted  . Abdominal pain 11/10/2017  . Status post primary low transverse cesarean section 02/25/2017  . History of gestational diabetes mellitus 08/21/2016    Past Medical History: Past Medical History:  Diagnosis Date  . Medical history non-contributory     Past Surgical History: Past Surgical History:  Procedure Laterality Date  . CESAREAN SECTION N/A 02/25/2017   Procedure: CESAREAN SECTION;  Surgeon: Levie Heritage, DO;  Location: [facility] BIR[facility]ING SU[facility];  Service: Obstetrics;  Laterality: N/A;  . NO PAST SURGERIES      Social History: Social History   Tobacco Use  . Smoking status: Current Every Day Smoker    Last attempt to quit: 06/17/2016    Years since quitting: 1.4  . Smokeless tobacco: Current User  Substance Use Topics  . Alcohol use: Yes  . Drug use: Not Currently    Types: Marijuana    Comment: Denies use since delivery 04/07/17   Additional social history: lives with mom, sister and baby  Please also refer to relevant sections of EMR.  Family History: Family History  Problem Relation Age of Onset  . Diabetes Mother   .  Diabetes Father   . Diabetes Maternal Aunt      Allergies and Medications: No Known Allergies No current facility-administered medications on file prior to encounter.    Current Outpatient Medications on File Prior to Encounter  Medication Sig Dispense Refill  . ACCU-CHEK FASTCLIX LANCETS MISC 1 Device by Percutaneous route 4 (four) times daily. (Patient not taking: Reported on 04/07/2017) 100 each 12  . glucose blood (ACCU-CHEK GUIDE) test strip Use as  instructed (Patient not taking: Reported on 04/07/2017) 100 each 12  . oxyCODONE (OXY IR/ROXICODONE) 5 MG immediate release tablet Take 1 tablet (5 mg total) by mouth every 4 (four) hours as needed for up to 10 doses (pain scale 4-7). (Patient not taking: Reported on 04/07/2017) 30 tablet 0    Objective: BP 104/74 (BP Location: Left Arm)   Pulse (!) 58   Temp 98.3 F (36.8 C) (Oral)   Resp 16   LMP 10/20/2017   SpO2 98%  Exam: Physical Exam  Constitutional: She is oriented to person, place, and time. She appears well-developed and well-nourished.  Cardiovascular: Normal rate and normal heart sounds. Exam reveals no gallop and no friction rub.  No murmur heard. Pulmonary/Chest: Effort normal and breath sounds normal. No stridor. No respiratory distress. She has no wheezes. She exhibits no tenderness.  Abdominal: Soft. There is no hepatosplenomegaly. There is guarding. There is no rigidity and no tenderness at McBurney's point.  Tender to palpation in LUQ, RUQ and epigastrum. Negative Murphys sign.  Neurological: She is alert and oriented to person, place, and time.  Psychiatric: She has a normal mood and affect. Her behavior is normal.    Labs and Imaging: CBC BMET  Recent Labs  Lab 11/10/17 0826  WBC 8.9  HGB 14.9  HCT 45.3  PLT 315   Recent Labs  Lab 11/10/17 0826  NA 140  K 3.6  CL 107  CO2 22  BUN 8  CREATININE 0.64  GLUCOSE 130*  CALCIUM 9.4     Ct Abdomen Pelvis W Contrast  Result Date: 11/10/2017 CLINICAL DATA:  Right upper quadrant pain.  Suspect cholecystitis. EXAM: CT ABDOMEN AND PELVIS WITH CONTRAST TECHNIQUE: Multidetector CT imaging of the abdomen and pelvis was performed using the standard protocol following bolus administration of intravenous contrast. CONTRAST:  OMNIPAQUE IOHEXOL 300 MG/ML  SOLN COMPARISON:  None. FINDINGS: Lower chest: No acute abnormality. Hepatobiliary: Heterogeneous attenuation of the liver without focal suspicious abnormality.  There is diffuse enhancement of the gallbladder wall with mild gallbladder wall edema. No stones identified. No biliary ductal dilatation identified. Pancreas: Unremarkable. No pancreatic ductal dilatation or surrounding inflammatory changes. Spleen: Normal in size without focal abnormality. Adrenals/Urinary Tract: The adrenal glands are unremarkable. No kidney mass or hydronephrosis identified. The urinary bladder appears normal. Stomach/Bowel: Stomach and the small bowel loops have a normal course and caliber. The appendix is visualized and appears normal. No pathologic dilatation of the colon. Vascular/Lymphatic: Normal appearance of the abdominal aorta. No abdominopelvic adenopathy. Reproductive: The uterus and adnexal structures are unremarkable. Other: Small amount of free fluid noted within the pelvis. Musculoskeletal: No acute or significant osseous findings. IMPRESSION: 1. Gallbladder wall enhancement and mild edema is noted. Although nonspecific findings may reflect cholecystitis. Advise further evaluation with gallbladder sonogram. 2. Heterogeneous attenuation throughout the liver which may be seen with hepatitis. No bile duct dilatation or suspicious mass identified. Electronically Signed   By: Signa Kell M.D.   On: 11/10/2017 12:04   US Abdomen Limited  Result  Date: 11/10/2017 CLINICAL DATA:  Right upper quadrant pain for 1 week EXAM: ULTRASOUND ABDOMEN LIMITED RIGHT UPPER QUADRANT COMPARISON:  CT abdomen pelvis 11/10/2017 FINDINGS: Gallbladder: There is a 4 mm calculus within the gallbladder. A negative sonographic Eulah PontMurphy sign was reported by the sonographer. No pericholecystic fluid or gallbladder wall thickening. Common bile duct: Diameter: 3 mm Liver: Heterogeneous hepatic echotexture, as demonstrated the concomitant CT. Portal vein is patent on color Doppler imaging with normal direction of blood flow towards the liver. IMPRESSION: 1. Cholelithiasis without other evidence of acute  cholecystitis. 2. Heterogeneous hepatic echogenicity, which is nonspecific. Electronically Signed   By: Deatra RobinsonKevin  Herman M.D.   On: 11/10/2017 14:36   Mirian MoFrank, Peter, MD 11/10/2017, 6:37 PM PGY-1, [facility] FPTS Intern pager: [phone], text pages welcome   FPTS Upper-Level Resident Addendum  I have independently interviewed and examined the patient. I have discussed the above with the original author and agree with their documentation. My edits for correction/addition/clarification are in green. Please see also any attending notes.   Ellwood DenseAlison Rumball, DO PGY-2, [facility] 11/10/2017 10:49 PM  FPTS Service pager: [phone] (text pages welcome through [facility])

## 2017-11-10 NOTE — ED Triage Notes (Signed)
Translator /pt stated, Aimee Lopez had the same but not as strong. I started my period yesterday and stopped.

## 2017-11-10 NOTE — Consult Note (Signed)
Reason for Consult:abd pain Referring Physician: Dr Nanavati  Aimee Lopez is an 21 y.o. female.  HPI: 21 yof with ruq and epigastric pain primarily that has been occurring for over a week intermittently current episode not going away, nothing making it better, has n/v associated with it. No fevers.  She came to er and was found to have elevated ast/alt/tb.  Us showed a single 4 mm stone in gb with no fluid or gbw thickening.  Ct shows enhancement of gb wallwith heterogenous appearance of liver.  I was asked tosee her by er staff.  H and p was done with assistance by interpreter via [employer]   Past Medical History:  Diagnosis Date  . Medical history non-contributory     Past Surgical History:  Procedure Laterality Date  . CESAREAN SECTION N/A 02/25/2017   Procedure: CESAREAN SECTION;  Surgeon: Stinson, Jacob J, DO;  Location: [facility] BIRTHING SUITES;  Service: Obstetrics;  Laterality: N/A;  . NO PAST SURGERIES      Family History  Problem Relation Age of Onset  . Diabetes Mother   . Diabetes Father   . Diabetes Maternal Aunt     Social History:  reports that she has been smoking.  She uses smokeless tobacco. She reports that she drinks alcohol. She reports that she has current or past drug history. Drug: Marijuana.  Allergies: No Known Allergies  Medications: I have reviewed the patient's current medications.  Results for orders placed or performed during the hospital encounter of 11/10/17 (from the past 48 hour(s))  Lipase, blood     Status: None   Collection Time: 11/10/17  8:26 AM  Result Value Ref Range   Lipase 43 11 - 51 U/L    Comment: Performed at [facility], [address] [place], [place] [address]  Comprehensive metabolic panel     Status: Abnormal   Collection Time: 11/10/17  8:26 AM  Result Value Ref Range   Sodium 140 135 - 145 mmol/L   Potassium 3.6 3.5 - 5.1 mmol/L   Chloride 107 98 - 111 mmol/L   CO2 22 22 - 32 mmol/L   Glucose, Bld 130 (H) 70  - 99 mg/dL   BUN 8 6 - 20 mg/dL   Creatinine, Ser 0.64 0.44 - 1.00 mg/dL   Calcium 9.4 8.9 - 10.3 mg/dL   Total Protein 7.7 6.5 - 8.1 g/dL   Albumin 3.9 3.5 - 5.0 g/dL   AST 488 (H) 15 - 41 U/L   ALT 459 (H) 0 - 44 U/L   Alkaline Phosphatase 116 38 - 126 U/L   Total Bilirubin 2.2 (H) 0.3 - 1.2 mg/dL   GFR calc non Af Amer >60 >60 mL/min   GFR calc Af Amer >60 >60 mL/min    Comment: (NOTE) The eGFR has been calculated using the CKD EPI equation. This calculation has not been validated in all clinical situations. eGFR's persistently <60 mL/min signify possible Chronic Kidney Disease.    Anion gap 11 5 - 15    Comment: Performed at [facility], [address] [place], [place] [address]  CBC     Status: None   Collection Time: 11/10/17  8:26 AM  Result Value Ref Range   WBC 8.9 4.0 - 10.5 K/uL   RBC 4.95 3.87 - 5.11 MIL/uL   Hemoglobin 14.9 12.0 - 15.0 g/dL   HCT 45.3 36.0 - 46.0 %   MCV 91.5 78.0 - 100.0 fL   MCH   30.1 26.0 - 34.0 pg   MCHC 32.9 30.0 - 36.0 g/dL   RDW 13.5 11.5 - 15.5 %   Platelets 315 150 - 400 K/uL    Comment: Performed at [facility], [address] [place], [place] [address]  I-Stat beta hCG blood, ED     Status: None   Collection Time: 11/10/17  8:36 AM  Result Value Ref Range   I-stat hCG, quantitative <5.0 <5 mIU/mL   Comment 3            Comment:   GEST. AGE      CONC.  (mIU/mL)   <=1 WEEK        5 - 50     2 WEEKS       50 - 500     3 WEEKS       100 - 10,000     4 WEEKS     1,000 - 30,000        FEMALE AND NON-PREGNANT FEMALE:     LESS THAN 5 mIU/mL   Urinalysis, Routine w reflex microscopic     Status: Abnormal   Collection Time: 11/10/17  9:56 AM  Result Value Ref Range   Color, Urine AMBER (A) YELLOW    Comment: BIOCHEMICALS MAY BE AFFECTED BY COLOR   APPearance HAZY (A) CLEAR   Specific Gravity, Urine 1.031 (H) 1.005 - 1.030   pH 5.0 5.0 - 8.0   Glucose, UA NEGATIVE NEGATIVE mg/dL   Hgb urine dipstick LARGE (A) NEGATIVE    Bilirubin Urine NEGATIVE NEGATIVE   Ketones, ur 5 (A) NEGATIVE mg/dL   Protein, ur 30 (A) NEGATIVE mg/dL   Nitrite NEGATIVE NEGATIVE   Leukocytes, UA NEGATIVE NEGATIVE   RBC / HPF 6-10 0 - 5 RBC/hpf   WBC, UA 0-5 0 - 5 WBC/hpf   Bacteria, UA RARE (A) NONE SEEN   Squamous Epithelial / LPF 0-5 0 - 5   Mucus PRESENT    Hyaline Casts, UA PRESENT     Comment: Performed at [facility], [address] [place], [place] [address]  Acetaminophen level     Status: Abnormal   Collection Time: 11/10/17 10:28 AM  Result Value Ref Range   Acetaminophen (Tylenol), Serum <10 (L) 10 - 30 ug/mL    Comment: (NOTE) Therapeutic concentrations vary significantly. A range of 10-30 ug/mL  may be an effective concentration for many patients. However, some  are best treated at concentrations outside of this range. Acetaminophen concentrations >150 ug/mL at 4 hours after ingestion  and >50 ug/mL at 12 hours after ingestion are often associated with  toxic reactions. Performed at [facility], [address] [place], [place] [address]   Protime-INR     Status: None   Collection Time: 11/10/17  3:51 PM  Result Value Ref Range   Prothrombin Time 14.8 11.4 - 15.2 seconds   INR 1.17     Comment: Performed at [facility], [address] , [place] [address]    Ct Abdomen Pelvis W Contrast  Result Date: 11/10/2017 CLINICAL DATA:  Right upper quadrant pain.  Suspect cholecystitis. EXAM: CT ABDOMEN AND PELVIS WITH CONTRAST TECHNIQUE: Multidetector CT imaging of the abdomen and pelvis was performed using the standard protocol following bolus administration of intravenous contrast. CONTRAST:  100mL OMNIPAQUE IOHEXOL 300 MG/ML  SOLN COMPARISON:  None. FINDINGS: Lower chest: No acute abnormality. Hepatobiliary: Heterogeneous attenuation of the liver without focal suspicious abnormality.   There is diffuse enhancement of the gallbladder wall with mild gallbladder wall edema. No stones  identified. No biliary ductal dilatation identified. Pancreas: Unremarkable. No pancreatic ductal dilatation or surrounding inflammatory changes. Spleen: Normal in size without focal abnormality. Adrenals/Urinary Tract: The adrenal glands are unremarkable. No kidney mass or hydronephrosis identified. The urinary bladder appears normal. Stomach/Bowel: Stomach and the small bowel loops have a normal course and caliber. The appendix is visualized and appears normal. No pathologic dilatation of the colon. Vascular/Lymphatic: Normal appearance of the abdominal aorta. No abdominopelvic adenopathy. Reproductive: The uterus and adnexal structures are unremarkable. Other: Small amount of free fluid noted within the pelvis. Musculoskeletal: No acute or significant osseous findings. IMPRESSION: 1. Gallbladder wall enhancement and mild edema is noted. Although nonspecific findings may reflect cholecystitis. Advise further evaluation with gallbladder sonogram. 2. Heterogeneous attenuation throughout the liver which may be seen with hepatitis. No bile duct dilatation or suspicious mass identified. Electronically Signed   By: Taylor  Stroud M.D.   On: 11/10/2017 12:04   Us Abdomen Limited  Result Date: 11/10/2017 CLINICAL DATA:  Right upper quadrant pain for 1 week EXAM: ULTRASOUND ABDOMEN LIMITED RIGHT UPPER QUADRANT COMPARISON:  CT abdomen pelvis 11/10/2017 FINDINGS: Gallbladder: There is a 4 mm calculus within the gallbladder. A negative sonographic Murphy sign was reported by the sonographer. No pericholecystic fluid or gallbladder wall thickening. Common bile duct: Diameter: 3 mm Liver: Heterogeneous hepatic echotexture, as demonstrated the concomitant CT. Portal vein is patent on color Doppler imaging with normal direction of blood flow towards the liver. IMPRESSION: 1. Cholelithiasis without other evidence of acute cholecystitis. 2. Heterogeneous hepatic echogenicity, which is nonspecific. Electronically Signed   By:  Kevin  Herman M.D.   On: 11/10/2017 14:36    Review of Systems  Gastrointestinal: Positive for abdominal pain, nausea and vomiting.  All other systems reviewed and are negative.  Blood pressure 104/74, pulse (!) 58, temperature 98.3 F (36.8 C), temperature source Oral, resp. rate 16, last menstrual period 10/20/2017, SpO2 98 %, currently breastfeeding. Physical Exam  Vitals reviewed. Constitutional: She is oriented to person, place, and time. She appears well-developed and well-nourished.  HENT:  Head: Normocephalic and atraumatic.  Right Ear: External ear normal.  Eyes: EOM are normal. No scleral icterus.  Neck: Neck supple.  Cardiovascular: Normal rate, regular rhythm and normal heart sounds.  Respiratory: Effort normal and breath sounds normal.  GI: Soft. Bowel sounds are normal. She exhibits no distension. There is tenderness in the epigastric area.  Musculoskeletal: Normal range of motion.  Neurological: She is alert and oriented to person, place, and time.  Skin: Skin is warm and dry. No rash noted.  Psychiatric: She has a normal mood and affect. Her behavior is normal. Thought content normal.    Assessment/Plan: Elevated lfts, cholelithiasis  She may very well have biliary colic as source of pain but need to rule out hepatic source.  Her transaminases and ct may be source of current issues.  I think admission, recheck labs in am. If these are better may just need cholecystectomy with continued pain.  If not I think gi evaluation is appropriate. I discussed this plan with patient through interpreter.   Matthew Wakefield 11/10/2017, 7:20 PM     

## 2017-11-10 NOTE — ED Triage Notes (Signed)
Translator/pt stated, Since last night Ive been having strong cramps and vomited 3 times.

## 2017-11-10 NOTE — Discharge Instructions (Addendum)
Your liver is abnormal on today's examination. Please follow up with [facility] next week for further evaluation of your condition.  Take zofran as needed for nausea.  Avoid using tylenol, any kind of street drug or alcohol as it can be harmful for your liver.   __________________________________________________________________________________________________________   Your appointment is at 10:30 AM, please arrive at least 30 min before your appointment to complete your check in paperwork.  If you are unable to arrive 30 min prior to your appointment time we may have to cancel or reschedule you.  LAPAROSCOPIC SURGERY: POST OP INSTRUCTIONS  1. DIET: Follow a light bland diet the first 24 hours after arrival home, such as soup, liquids, crackers, etc. Be sure to include lots of fluids daily. Avoid fast food or heavy meals as your are more likely to get nauseated. Eat a low fat the next few days after surgery.  2. Take your usually prescribed home medications unless otherwise directed. 3. PAIN CONTROL:  1. Pain is best controlled by a usual combination of three different methods TOGETHER:  1. Ice/Heat 2. Over the counter pain medication 3. Prescription pain medication 2. Most patients will experience some swelling and bruising around the incisions. Ice packs or heating pads (30-60 minutes up to 6 times a day) will help. Use ice for the first few days to help decrease swelling and bruising, then switch to heat to help relax tight/sore spots and speed recovery. Some people prefer to use ice alone, heat alone, alternating between ice & heat. Experiment to what works for you. Swelling and bruising can take several weeks to resolve.  3. It is helpful to take an over-the-counter pain medication regularly for the first few weeks. Choose one of the following that works best for you:  1. Naproxen (Aleve, etc) Two 220mg  tabs twice a day 2. Ibuprofen (Advil, etc) Three 200mg  tabs four times a day  (every meal & bedtime) 3. Acetaminophen (Tylenol, etc) 500-650mg  four times a day (every meal & bedtime) 4. A prescription for pain medication (such as oxycodone, hydrocodone, etc) should be given to you upon discharge. Take your pain medication as prescribed.  1. If you are having problems/concerns with the prescription medicine (does not control pain, nausea, vomiting, rash, itching, etc), please call us [phone] to see if we need to switch you to a different pain medicine that will work better for you and/or control your side effect better. 2. If you need a refill on your pain medication, please contact your pharmacy. They will contact our office to request authorization. Prescriptions will not be filled after 5 pm or on week-ends. 4. Avoid getting constipated. Between the surgery and the pain medications, it is common to experience some constipation. Increasing fluid intake and taking a fiber supplement (such as Metamucil, Citrucel, FiberCon, MiraLax, etc) 1-2 times a day regularly will usually help prevent this problem from occurring. A mild laxative (prune juice, Milk of Magnesia, MiraLax, etc) should be taken according to package directions if there are no bowel movements after 48 hours.  5. Watch out for diarrhea. If you have many loose bowel movements, simplify your diet to bland foods & liquids for a few days. Stop any stool softeners and decrease your fiber supplement. Switching to mild anti-diarrheal medications (Kayopectate, Pepto Bismol) can help. If this worsens or does not improve, please call us. 6. Wash / shower every day. You may shower over the dressings as they are waterproof. Continue to shower over incision(s) after the  dressing is off. 7. Remove your waterproof bandages 5 days after surgery. You may leave the incision open to air. You may replace a dressing/Band-Aid to cover the incision for comfort if you wish.  8. ACTIVITIES as tolerated:  1. You may resume regular (light)  daily activities beginning the next day--such as daily self-care, walking, climbing stairs--gradually increasing activities as tolerated. If you can walk 30 minutes without difficulty, it is safe to try more intense activity such as jogging, treadmill, bicycling, low-impact aerobics, swimming, etc. 2. Save the most intensive and strenuous activity for last such as sit-ups, heavy lifting, contact sports, etc Refrain from any heavy lifting or straining until you are off narcotics for pain control.  3. DO NOT PUSH THROUGH PAIN. Let pain be your guide: If it hurts to do something, don't do it. Pain is your body warning you to avoid that activity for another week until the pain goes down. 4. You may drive when you are no longer taking prescription pain medication, you can comfortably wear a seatbelt, and you can safely maneuver your car and apply brakes. 5. You may have sexual intercourse when it is comfortable.  9. FOLLOW UP in our office  1. Please call [facility] at [phone] to set up an appointment to see your surgeon in the office for a follow-up appointment approximately 2-3 weeks after your surgery. 2. Make sure that you call for this appointment the day you arrive home to insure a convenient appointment time.      10. IF YOU HAVE DISABILITY OR FAMILY LEAVE FORMS, BRING THEM TO THE               OFFICE FOR PROCESSING.   WHEN TO CALL US [phone]:  1. Poor pain control 2. Reactions / problems with new medications (rash/itching, nausea, etc)  3. Fever over 101.5 F (38.5 C) 4. Inability to urinate 5. Nausea and/or vomiting 6. Worsening swelling or bruising 7. Continued bleeding from incision. 8. Increased pain, redness, or drainage from the incision  The clinic staff is available to answer your questions during regular business hours (8:30am-5pm). Please dont hesitate to call and ask to speak to one of our nurses for clinical concerns.  If you have a medical emergency, go to the nearest  emergency room or call 911.  A surgeon from [facility] is always on call at the [facility]   [facility], [place]  [address], [address], [place], [place] [address] ?  MAIN: [phone] ? TOLL FREE: [phone] ?  FAX [phone]  [email or web address]

## 2017-11-10 NOTE — Telephone Encounter (Signed)
I will call the pt after discharge

## 2017-11-10 NOTE — Telephone Encounter (Signed)
-----   Message from Unk LightningJennifer Lynne Lemmon, [place] sent at 11/10/2017  3:49 PM EDT ----- Regarding: Needs Appt- Amy has availability Fri per nurses Needs appt for elevated LFT's with APP would like this week but I dont think avilability so next week can work. Will need repeat LFT's this Thursday/friday-sent to Amy who the appt will be with.   Pt being sent home from ER today, will need to be called and notified.  Thanks-JLL

## 2017-11-10 NOTE — ED Provider Notes (Signed)
[facility] EMERGENCY DEPARTMENT Provider Note   CSN: [id number] Arrival date & time: 11/10/17  0802     History   Chief Complaint Chief Complaint  Patient presents with  . Abdominal Pain  . Emesis    HPI Aimee Lopez is a 22 y.o. female.  The history is provided by the patient. The history is limited by a language barrier. A language interpreter was used.  Abdominal Pain   Associated symptoms include vomiting.  Emesis   Associated symptoms include abdominal pain.     22 year old female, Hispanic speaking presenting for evaluation of abdominal pain.  History obtained through language interpreter.  For the past 3 days patient has had upper abdominal pain.  Described pain as a colicky contraction type of pain rated as 5 out of 10, waxing waning with associated nausea, has vomited 3 times today without any diarrhea.  Pain seems to be worse at nighttime.  Had similar pain last week but this is much more intense.  Increasing pain with eating especially fatty food.  She does report traveling in May to [place].  She mentioned she is up-to-date with her immunization.  She denies any significant alcohol or drug use.  She denies having fever or chills or dysuria.  No chest pain shortness of breath or cough.  She did report vaginal spotting today but her last menstrual period was last month.    Past Medical History:  Diagnosis Date  . Medical history non-contributory     Patient Active Problem List   Diagnosis Date Noted  . Status post primary low transverse cesarean section 02/25/2017  . History of gestational diabetes mellitus 08/21/2016    Past Surgical History:  Procedure Laterality Date  . CESAREAN SECTION N/A 02/25/2017   Procedure: CESAREAN SECTION;  Surgeon: Truett Mainland, DO;  Location: [facility];  Service: Obstetrics;  Laterality: N/A;  . NO PAST SURGERIES       OB History    Gravida  2   Para  1   Term  1   Preterm  0   AB  1   Living  1     SAB  1   TAB  0   Ectopic  0   Multiple  0   Live Births  1            Home Medications    Prior to Admission medications   Medication Sig Start Date End Date Taking? Authorizing Provider  ACCU-CHEK FASTCLIX LANCETS MISC 1 Device by Percutaneous route 4 (four) times daily. Patient not taking: Reported on 04/07/2017 08/21/16   Truett Mainland, DO  glucose blood (ACCU-CHEK GUIDE) test strip Use as instructed Patient not taking: Reported on 04/07/2017 08/21/16   Truett Mainland, DO  medroxyPROGESTERone (PROVERA) 10 MG tablet Take 1 tablet (10 mg total) by mouth daily. Use for ten days 04/07/17   Tamala Julian, [place], CNM  norethindrone (MICRONOR,CAMILA,ERRIN) 0.35 MG tablet Take 1 tablet (0.35 mg total) by mouth daily. 04/07/17   Tamala Julian, [place], CNM  oxyCODONE (OXY IR/ROXICODONE) 5 MG immediate release tablet Take 1 tablet (5 mg total) by mouth every 4 (four) hours as needed for up to 10 doses (pain scale 4-7). Patient not taking: Reported on 04/07/2017 02/28/17   Sherene Sires, DO  Prenatal Vit-Fe Fumarate-FA (PREPLUS) 27-1 MG TABS Take 1 tablet by mouth daily. 11/28/16   Guss Bunde, MD    Family History Family History  Problem Relation Age of  Onset  . Diabetes Mother   . Diabetes Father   . Diabetes Maternal Aunt     Social History Social History   Tobacco Use  . Smoking status: Current Every Day Smoker    Last attempt to quit: 06/17/2016    Years since quitting: 1.4  . Smokeless tobacco: Current User  Substance Use Topics  . Alcohol use: Yes  . Drug use: Not Currently    Types: Marijuana    Comment: Denies use since delivery 04/07/17     Allergies   Patient has no known allergies.   Review of Systems Review of Systems  Gastrointestinal: Positive for abdominal pain and vomiting.  All other systems reviewed and are negative.    Physical Exam Updated Vital Signs BP 114/72 (BP Location: Right Arm)   Pulse 60   Temp 98.1 F  (36.7 C) (Oral)   Resp 14   LMP 10/20/2017   SpO2 99%   Physical Exam  Constitutional: She appears well-developed and well-nourished. No distress.  HENT:  Head: Atraumatic.  Eyes: Conjunctivae are normal.  Neck: Neck supple.  Cardiovascular: Normal rate and regular rhythm.  Pulmonary/Chest: Effort normal and breath sounds normal.  Abdominal: Soft. Normal appearance and bowel sounds are normal. There is tenderness in the right upper quadrant, right lower quadrant and epigastric area. There is positive Murphy's sign. There is no rebound, no guarding and no tenderness at McBurney's point.  Neurological: She is alert.  Skin: No rash noted.  Psychiatric: She has a normal mood and affect.  Nursing note and vitals reviewed.    ED Treatments / Results  Labs (all labs ordered are listed, but only abnormal results are displayed) Labs Reviewed  COMPREHENSIVE METABOLIC PANEL - Abnormal; Notable for the following components:      Result Value   Glucose, Bld 130 (*)    AST 488 (*)    ALT 459 (*)    Total Bilirubin 2.2 (*)    All other components within normal limits  URINALYSIS, ROUTINE W REFLEX MICROSCOPIC - Abnormal; Notable for the following components:   Color, Urine AMBER (*)    APPearance HAZY (*)    Specific Gravity, Urine 1.031 (*)    Hgb urine dipstick LARGE (*)    Ketones, ur 5 (*)    Protein, ur 30 (*)    Bacteria, UA RARE (*)    All other components within normal limits  ACETAMINOPHEN LEVEL - Abnormal; Notable for the following components:   Acetaminophen (Tylenol), Serum <10 (*)    All other components within normal limits  LIPASE, BLOOD  CBC  HEPATITIS PANEL, ACUTE  PROTIME-INR  ANTINUCLEAR ANTIBODIES, IFA  I-STAT BETA HCG BLOOD, ED ([facility], [facility], [facility] ONLY)    EKG None  Radiology Ct Abdomen Pelvis W Contrast  Result Date: 11/10/2017 CLINICAL DATA:  Right upper quadrant pain.  Suspect cholecystitis. EXAM: CT ABDOMEN AND PELVIS WITH CONTRAST TECHNIQUE: Multidetector  CT imaging of the abdomen and pelvis was performed using the standard protocol following bolus administration of intravenous contrast. CONTRAST:  112m OMNIPAQUE IOHEXOL 300 MG/ML  SOLN COMPARISON:  None. FINDINGS: Lower chest: No acute abnormality. Hepatobiliary: Heterogeneous attenuation of the liver without focal suspicious abnormality. There is diffuse enhancement of the gallbladder wall with mild gallbladder wall edema. No stones identified. No biliary ductal dilatation identified. Pancreas: Unremarkable. No pancreatic ductal dilatation or surrounding inflammatory changes. Spleen: Normal in size without focal abnormality. Adrenals/Urinary Tract: The adrenal glands are unremarkable. No kidney mass or hydronephrosis identified. The  urinary bladder appears normal. Stomach/Bowel: Stomach and the small bowel loops have a normal course and caliber. The appendix is visualized and appears normal. No pathologic dilatation of the colon. Vascular/Lymphatic: Normal appearance of the abdominal aorta. No abdominopelvic adenopathy. Reproductive: The uterus and adnexal structures are unremarkable. Other: Small amount of free fluid noted within the pelvis. Musculoskeletal: No acute or significant osseous findings. IMPRESSION: 1. Gallbladder wall enhancement and mild edema is noted. Although nonspecific findings may reflect cholecystitis. Advise further evaluation with gallbladder sonogram. 2. Heterogeneous attenuation throughout the liver which may be seen with hepatitis. No bile duct dilatation or suspicious mass identified. Electronically Signed   By: Kerby Moors M.D.   On: 11/10/2017 12:04   US Abdomen Limited  Result Date: 11/10/2017 CLINICAL DATA:  Right upper quadrant pain for 1 week EXAM: ULTRASOUND ABDOMEN LIMITED RIGHT UPPER QUADRANT COMPARISON:  CT abdomen pelvis 11/10/2017 FINDINGS: Gallbladder: There is a 4 mm calculus within the gallbladder. A negative sonographic Percell Miller sign was reported by the sonographer.  No pericholecystic fluid or gallbladder wall thickening. Common bile duct: Diameter: 3 mm Liver: Heterogeneous hepatic echotexture, as demonstrated the concomitant CT. Portal vein is patent on color Doppler imaging with normal direction of blood flow towards the liver. IMPRESSION: 1. Cholelithiasis without other evidence of acute cholecystitis. 2. Heterogeneous hepatic echogenicity, which is nonspecific. Electronically Signed   By: Ulyses Jarred M.D.   On: 11/10/2017 14:36    Procedures Procedures (including critical care time)  Medications Ordered in ED Medications  morphine 4 MG/ML injection 4 mg (4 mg Intravenous Given 11/10/17 1039)  ondansetron (ZOFRAN) injection 4 mg (4 mg Intravenous Given 11/10/17 1040)  sodium chloride 0.9 % bolus 1,000 mL (0 mLs Intravenous Stopped 11/10/17 1153)  iohexol (OMNIPAQUE) 300 MG/ML solution 100 mL (100 mLs Intravenous Contrast Given 11/10/17 1123)     Initial Impression / Assessment and Plan / ED Course  I have reviewed the triage vital signs and the nursing notes.  Pertinent labs & imaging results that were available during my care of the patient were reviewed by me and considered in my medical decision making (see chart for details).     BP 101/64 (BP Location: Right Arm)   Pulse (!) 52   Temp 98.1 F (36.7 C) (Oral)   Resp 16   LMP 10/20/2017   SpO2 100%    Final Clinical Impressions(s) / ED Diagnoses   Final diagnoses:  Upper abdominal pain  Transaminitis  Calculus of gallbladder and bile duct without cholecystitis or obstruction    ED Discharge Orders        Ordered    ondansetron (ZOFRAN) 4 MG tablet  Every 8 hours PRN     11/10/17 1555     10:28 AM This is a 22 year old female here with upper abdominal pain for the past several days.  Her urine shows large amount of hemoglobin and urine dipsticks however patient denies any urinary discomfort and she mentioned having some vaginal spotting earlier today.  Her pregnancy test is  negative.  She has normal lipase.  Patient does have transaminitis with AST 488, ALT 459, normal alk phos but mildly elevated total bili of 2.2.  She has normal WBC.  Patient will benefit from an abdominal pelvic CT scan, if negative, may consider limited abdominal ultrasound.  Pain medication, IV fluid, and antinausea medication given.  11:21 AM Normal Tylenol level.  Urinalysis with with large hemoglobin and urine dipstick.  Patient denies vaginal discharge and does not have  lower abdominal pain to suggest Bobette Mo syndrome in the setting of transaminitis.  1:22 PM CT scan of the abdomen and pelvis demonstrate gallbladder wall enhancement and mild edema noted which is nonspecific but may reflect cholecystitis.  Will obtain limited abdominal ultrasound to assess for gallbladder etiology.  Evidence of heterogeneous attenuation throughout the liver which may be seen with hepatitis.  No bile duct dilatation noted.  Patient does have evidence of transaminitis.  If her limited abdominal ultrasound is negative, will consult GI for recommendation.  Currently patient is resting comfortably and report her symptom is improving.  3:49 PM Appreciate consultation from GI specialist from the bowel gastroenterology.  Specialist recommend obtaining INR, as well as ANA.  The office will call patient for an outpatient follow-up.  Patient discharged home with Zofran for nausea.  Encourage patient to avoid any medication that can be harmful for her liver and also to avoid any street drugs. Strict return precaution given.  Currently pt's sxs improves.  She tolerates PO and stable for discharge.     Domenic Moras, PA-C 11/10/17 1606    Quintella Reichert, MD 11/11/17 [phone]

## 2017-11-10 NOTE — Telephone Encounter (Signed)
Victorino DikeJennifer,  Amy had no appt spots but you had 8/14 at 1:45 pm.  I added him to that spot.

## 2017-11-10 NOTE — ED Notes (Signed)
Patient transported to US 

## 2017-11-11 ENCOUNTER — Encounter (HOSPITAL_COMMUNITY): Payer: Self-pay

## 2017-11-11 ENCOUNTER — Encounter (HOSPITAL_COMMUNITY)
Admission: EM | Disposition: A | Discharge status: Home / Self Care | Payer: Self-pay | Source: Home / Self Care | Attending: Family Medicine

## 2017-11-11 ENCOUNTER — Inpatient Hospital Stay (HOSPITAL_COMMUNITY): Payer: Medicaid Other | Admitting: Anesthesiology

## 2017-11-11 ENCOUNTER — Other Ambulatory Visit: Payer: Self-pay

## 2017-11-11 DIAGNOSIS — R112 Nausea with vomiting, unspecified: Secondary | ICD-10-CM

## 2017-11-11 DIAGNOSIS — R7401 Elevation of levels of liver transaminase levels: Secondary | ICD-10-CM

## 2017-11-11 DIAGNOSIS — R740 Nonspecific elevation of levels of transaminase and lactic acid dehydrogenase [LDH]: Secondary | ICD-10-CM

## 2017-11-11 DIAGNOSIS — K8000 Calculus of gallbladder with acute cholecystitis without obstruction: Secondary | ICD-10-CM

## 2017-11-11 DIAGNOSIS — K8070 Calculus of gallbladder and bile duct without cholecystitis without obstruction: Secondary | ICD-10-CM

## 2017-11-11 HISTORY — PX: CHOLECYSTECTOMY: SHX55

## 2017-11-11 LAB — CBC
HCT: 38.4 % (ref 36.0–46.0)
Hemoglobin: 12.7 g/dL (ref 12.0–15.0)
MCH: 30.1 pg (ref 26.0–34.0)
MCHC: 33.1 g/dL (ref 30.0–36.0)
MCV: 91 fL (ref 78.0–100.0)
PLATELETS: 244 10*3/uL (ref 150–400)
RBC: 4.22 MIL/uL (ref 3.87–5.11)
RDW: 13.4 % (ref 11.5–15.5)
WBC: 7 10*3/uL (ref 4.0–10.5)

## 2017-11-11 LAB — COMPREHENSIVE METABOLIC PANEL
ALK PHOS: 104 U/L (ref 38–126)
ALT: 452 U/L — AB (ref 0–44)
AST: 201 U/L — AB (ref 15–41)
Albumin: 3.1 g/dL — ABNORMAL LOW (ref 3.5–5.0)
Anion gap: 8 (ref 5–15)
BUN: 5 mg/dL — AB (ref 6–20)
CHLORIDE: 107 mmol/L (ref 98–111)
CO2: 26 mmol/L (ref 22–32)
CREATININE: 0.73 mg/dL (ref 0.44–1.00)
Calcium: 8.6 mg/dL — ABNORMAL LOW (ref 8.9–10.3)
GFR calc non Af Amer: >60 mL/min (ref >60)
Glucose, Bld: 100 mg/dL — ABNORMAL HIGH (ref 70–99)
Potassium: 3.5 mmol/L (ref 3.5–5.1)
Sodium: 141 mmol/L (ref 135–145)
Total Bilirubin: 1.1 mg/dL (ref 0.3–1.2)
Total Protein: 5.7 g/dL — ABNORMAL LOW (ref 6.5–8.1)

## 2017-11-11 LAB — HEPATITIS PANEL, ACUTE
HCV AB: 0.1 {s_co_ratio} (ref 0.0–0.9)
HEP A IGM: NEGATIVE
HEP B C IGM: NEGATIVE
Hepatitis B Surface Ag: NEGATIVE

## 2017-11-11 LAB — ANTINUCLEAR ANTIBODIES, IFA: ANTINUCLEAR ANTIBODIES, IFA: NEGATIVE

## 2017-11-11 SURGERY — LAPAROSCOPIC CHOLECYSTECTOMY
Anesthesia: General | Site: Abdomen

## 2017-11-11 MED ORDER — BUPIVACAINE HCL (PF) 0.25 % IJ SOLN
INTRAMUSCULAR | Status: AC
  Filled 2017-11-11: qty 30

## 2017-11-11 MED ORDER — MIDAZOLAM HCL 5 MG/5ML IJ SOLN
INTRAMUSCULAR | Status: DC | PRN
  Administered 2017-11-11: 2 mg via INTRAVENOUS

## 2017-11-11 MED ORDER — ROCURONIUM BROMIDE 10 MG/ML (PF) SYRINGE
PREFILLED_SYRINGE | INTRAVENOUS | Status: AC
  Filled 2017-11-11: qty 10

## 2017-11-11 MED ORDER — OXYCODONE HCL 5 MG PO TABS
5.0000 mg | ORAL_TABLET | ORAL | Status: DC | PRN
  Administered 2017-11-11 – 2017-11-12 (×2): 5 mg via ORAL
  Filled 2017-11-11 (×2): qty 1

## 2017-11-11 MED ORDER — 0.9 % SODIUM CHLORIDE (POUR BTL) OPTIME
TOPICAL | Status: DC | PRN
  Administered 2017-11-11: 1000 mL

## 2017-11-11 MED ORDER — HYDROMORPHONE HCL 1 MG/ML IJ SOLN: 0.5000 mg | INTRAMUSCULAR | Status: DC | PRN

## 2017-11-11 MED ORDER — BUPIVACAINE-EPINEPHRINE (PF) 0.25% -1:200000 IJ SOLN
INTRAMUSCULAR | Status: AC
  Filled 2017-11-11: qty 30

## 2017-11-11 MED ORDER — ONDANSETRON HCL 4 MG/2ML IJ SOLN
4.0000 mg | Freq: Four times a day (QID) | INTRAMUSCULAR | Status: DC | PRN
Start: 2017-11-11 — End: 2017-11-12
  Administered 2017-11-11 (×2): 4 mg via INTRAVENOUS

## 2017-11-11 MED ORDER — HYDROMORPHONE HCL 1 MG/ML IJ SOLN
INTRAMUSCULAR | Status: DC | PRN
  Administered 2017-11-11 (×2): .25 mg via INTRAVENOUS

## 2017-11-11 MED ORDER — DEXAMETHASONE SODIUM PHOSPHATE 10 MG/ML IJ SOLN
INTRAMUSCULAR | Status: AC
  Filled 2017-11-11: qty 1

## 2017-11-11 MED ORDER — SODIUM CHLORIDE 0.9 % IR SOLN
Status: DC | PRN
  Administered 2017-11-11: 1000 mL

## 2017-11-11 MED ORDER — KETOROLAC TROMETHAMINE 30 MG/ML IJ SOLN
INTRAMUSCULAR | Status: AC
  Filled 2017-11-11: qty 1

## 2017-11-11 MED ORDER — CEFAZOLIN SODIUM-DEXTROSE 2-3 GM-%(50ML) IV SOLR
INTRAVENOUS | Status: DC | PRN
  Administered 2017-11-11: 2 g via INTRAVENOUS

## 2017-11-11 MED ORDER — ROCURONIUM BROMIDE 10 MG/ML (PF) SYRINGE
PREFILLED_SYRINGE | INTRAVENOUS | Status: DC | PRN
  Administered 2017-11-11: 50 mg via INTRAVENOUS

## 2017-11-11 MED ORDER — MIDAZOLAM HCL 2 MG/2ML IJ SOLN
INTRAMUSCULAR | Status: AC
  Filled 2017-11-11: qty 2

## 2017-11-11 MED ORDER — PROPOFOL 10 MG/ML IV BOLUS
INTRAVENOUS | Status: AC
  Filled 2017-11-11: qty 20

## 2017-11-11 MED ORDER — DEXAMETHASONE SODIUM PHOSPHATE 10 MG/ML IJ SOLN
INTRAMUSCULAR | Status: DC | PRN
  Administered 2017-11-11: 5 mg via INTRAVENOUS

## 2017-11-11 MED ORDER — FENTANYL CITRATE (PF) 100 MCG/2ML IJ SOLN
INTRAMUSCULAR | Status: AC
  Filled 2017-11-11: qty 2

## 2017-11-11 MED ORDER — FENTANYL CITRATE (PF) 250 MCG/5ML IJ SOLN
INTRAMUSCULAR | Status: DC | PRN
  Administered 2017-11-11: 50 ug via INTRAVENOUS
  Administered 2017-11-11: 150 ug via INTRAVENOUS

## 2017-11-11 MED ORDER — SUGAMMADEX SODIUM 200 MG/2ML IV SOLN
INTRAVENOUS | Status: DC | PRN
  Administered 2017-11-11: 152.8 mg via INTRAVENOUS

## 2017-11-11 MED ORDER — DOCUSATE SODIUM 100 MG PO CAPS: 100.0000 mg | ORAL_CAPSULE | Freq: Two times a day (BID) | ORAL | Status: DC | PRN

## 2017-11-11 MED ORDER — LIDOCAINE 2% (20 MG/ML) 5 ML SYRINGE
INTRAMUSCULAR | Status: AC
  Filled 2017-11-11: qty 5

## 2017-11-11 MED ORDER — PROPOFOL 10 MG/ML IV BOLUS
INTRAVENOUS | Status: DC | PRN
  Administered 2017-11-11: 180 mg via INTRAVENOUS

## 2017-11-11 MED ORDER — ACETAMINOPHEN 500 MG PO TABS
1000.0000 mg | ORAL_TABLET | Freq: Three times a day (TID) | ORAL | Status: DC
Start: 2017-11-11 — End: 2017-11-12
  Administered 2017-11-11 – 2017-11-12 (×3): 1000 mg via ORAL
  Filled 2017-11-11 (×3): qty 2

## 2017-11-11 MED ORDER — HYDROMORPHONE HCL 1 MG/ML IJ SOLN
INTRAMUSCULAR | Status: AC
  Filled 2017-11-11: qty 0.5

## 2017-11-11 MED ORDER — FENTANYL CITRATE (PF) 250 MCG/5ML IJ SOLN
INTRAMUSCULAR | Status: AC
  Filled 2017-11-11: qty 5

## 2017-11-11 MED ORDER — KETOROLAC TROMETHAMINE 30 MG/ML IJ SOLN
INTRAMUSCULAR | Status: DC | PRN
  Administered 2017-11-11: 30 mg via INTRAVENOUS

## 2017-11-11 MED ORDER — SODIUM CHLORIDE 0.9 % IV SOLN
INTRAVENOUS | Status: DC
  Administered 2017-11-11 – 2017-11-12 (×2): via INTRAVENOUS

## 2017-11-11 MED ORDER — BUPIVACAINE HCL 0.25 % IJ SOLN
INTRAMUSCULAR | Status: DC | PRN
  Administered 2017-11-11: 7 mL

## 2017-11-11 MED ORDER — ONDANSETRON HCL 4 MG/2ML IJ SOLN
INTRAMUSCULAR | Status: AC
  Filled 2017-11-11: qty 2

## 2017-11-11 MED ORDER — LIDOCAINE 2% (20 MG/ML) 5 ML SYRINGE
INTRAMUSCULAR | Status: DC | PRN
  Administered 2017-11-11: 80 mg via INTRAVENOUS

## 2017-11-11 MED ORDER — LACTATED RINGERS IV SOLN
INTRAVENOUS | Status: DC
Start: 2017-11-11 — End: 2017-11-12
  Administered 2017-11-11: 11:00:00 via INTRAVENOUS

## 2017-11-11 MED ORDER — FENTANYL CITRATE (PF) 100 MCG/2ML IJ SOLN
25.0000 ug | INTRAMUSCULAR | Status: DC | PRN
  Administered 2017-11-11: 25 ug via INTRAVENOUS
  Administered 2017-11-11: 50 ug via INTRAVENOUS
  Administered 2017-11-11: 25 ug via INTRAVENOUS

## 2017-11-11 SURGICAL SUPPLY — 43 items
APPLIER CLIP 5 13 M/L LIGAMAX5 (MISCELLANEOUS)
CANISTER SUCT 3000ML PPV (MISCELLANEOUS) ×4 IMPLANT
CHLORAPREP W/TINT 26ML (MISCELLANEOUS) ×4 IMPLANT
CLIP APPLIE 5 13 M/L LIGAMAX5 (MISCELLANEOUS) IMPLANT
CLIP VESOLOCK MED LG 6/CT (CLIP) ×4 IMPLANT
COVER MAYO STAND STRL (DRAPES) ×4 IMPLANT
COVER SURGICAL LIGHT HANDLE (MISCELLANEOUS) ×4 IMPLANT
COVER TRANSDUCER ULTRASND (DRAPES) ×4 IMPLANT
DEFOGGER SCOPE WARMER CLEARIFY (MISCELLANEOUS) IMPLANT
DERMABOND ADVANCED (GAUZE/BANDAGES/DRESSINGS) ×2
DERMABOND ADVANCED .7 DNX12 (GAUZE/BANDAGES/DRESSINGS) ×2 IMPLANT
DRAPE C-ARM 42X72 X-RAY (DRAPES) ×4 IMPLANT
ELECT REM PT RETURN 9FT ADLT (ELECTROSURGICAL) ×4
ELECTRODE REM PT RTRN 9FT ADLT (ELECTROSURGICAL) ×2 IMPLANT
GLOVE BIO SURGEON STRL SZ7.5 (GLOVE) ×4 IMPLANT
GOWN STRL REUS W/ TWL LRG LVL3 (GOWN DISPOSABLE) ×4 IMPLANT
GOWN STRL REUS W/ TWL XL LVL3 (GOWN DISPOSABLE) ×2 IMPLANT
GOWN STRL REUS W/TWL LRG LVL3 (GOWN DISPOSABLE) ×4
GOWN STRL REUS W/TWL XL LVL3 (GOWN DISPOSABLE) ×2
GRASPER SUT TROCAR 14GX15 (MISCELLANEOUS) ×4 IMPLANT
IV CATH 14GX2 1/4 (CATHETERS) ×4 IMPLANT
KIT BASIN OR (CUSTOM PROCEDURE TRAY) ×4 IMPLANT
KIT TURNOVER KIT B (KITS) ×4 IMPLANT
NEEDLE INSUFFLATION 14GA 120MM (NEEDLE) ×4 IMPLANT
NS IRRIG 1000ML POUR BTL (IV SOLUTION) ×4 IMPLANT
PAD ARMBOARD 7.5X6 YLW CONV (MISCELLANEOUS) ×8 IMPLANT
POUCH LAPAROSCOPIC INSTRUMENT (MISCELLANEOUS) ×4 IMPLANT
POUCH RETRIEVAL ECOSAC 10 (ENDOMECHANICALS) IMPLANT
POUCH RETRIEVAL ECOSAC 10MM (ENDOMECHANICALS)
SCISSORS LAP 5X35 DISP (ENDOMECHANICALS) ×4 IMPLANT
SET CHOLANGIOGRAPHY FRANKLIN (SET/KITS/TRAYS/PACK) ×4 IMPLANT
SET IRRIG TUBING LAPAROSCOPIC (IRRIGATION / IRRIGATOR) ×4 IMPLANT
SLEEVE ENDOPATH XCEL 5M (ENDOMECHANICALS) ×8 IMPLANT
SPECIMEN JAR SMALL (MISCELLANEOUS) ×4 IMPLANT
STOPCOCK 4 WAY LG BORE MALE ST (IV SETS) ×4 IMPLANT
SUT MNCRL AB 4-0 PS2 18 (SUTURE) ×8 IMPLANT
TOWEL OR 17X24 6PK STRL BLUE (TOWEL DISPOSABLE) ×4 IMPLANT
TOWEL OR 17X26 10 PK STRL BLUE (TOWEL DISPOSABLE) ×4 IMPLANT
TRAY LAPAROSCOPIC MC (CUSTOM PROCEDURE TRAY) ×4 IMPLANT
TROCAR XCEL NON-BLD 11X100MML (ENDOMECHANICALS) ×4 IMPLANT
TROCAR XCEL NON-BLD 5MMX100MML (ENDOMECHANICALS) ×4 IMPLANT
TUBING INSUFFLATION (TUBING) ×4 IMPLANT
WATER STERILE IRR 1000ML POUR (IV SOLUTION) ×4 IMPLANT

## 2017-11-11 NOTE — Anesthesia Postprocedure Evaluation (Signed)
Anesthesia Post Note  Patient: Chrystine OilerGloria Ruiz Guzman  Procedure(s) Performed: LAPAROSCOPIC CHOLECYSTECTOMY (N/A Abdomen)     Patient location during evaluation: PACU Anesthesia Type: General Level of consciousness: awake Pain management: pain level controlled Vital Signs Assessment: post-procedure vital signs reviewed and stable Respiratory status: spontaneous breathing Cardiovascular status: stable Anesthetic complications: no    Last Vitals:  Vitals:   11/11/17 1419 11/11/17 1451  BP: 124/82 121/78  Pulse: (!) 57 61  Resp: 12 18  Temp: 36.8 C 37 C  SpO2: 96% 96%    Last Pain:  Vitals:   11/11/17 1451  TempSrc: Oral  PainSc:                  Trenisha Lafavor

## 2017-11-11 NOTE — Progress Notes (Signed)
Subjective/Chief Complaint: Pt doing well this AM Still with some RUQ pain   Objective: Vital signs in last 24 hours: Temp:  [97.9 Lopez (36.6 C)-98.5 Lopez (36.9 C)] 97.9 Lopez (36.6 C) (08/06 0518) Pulse Rate:  [49-93] 60 (08/06 0518) Resp:  [16-18] 17 (08/06 0518) BP: (101-119)/(62-76) 107/76 (08/06 0518) SpO2:  [98 %-100 %] 100 % (08/06 0518) Weight:  [76.4 kg (168 lb 8 oz)] 76.4 kg (168 lb 8 oz) (08/05 1957) Last BM Date: 11/12/17  Intake/Output from previous day: 08/05 0701 - 08/06 0700 In: 1240 [P.O.:240; IV Piggyback:1000] Out: 2 [Urine:2] Intake/Output this shift: No intake/output data recorded.  Constitutional: No acute distress, conversant, appears states age. Eyes: Anicteric sclerae, moist conjunctiva, no lid lag Lungs: Clear to auscultation bilaterally, normal respiratory effort CV: regular rate and rhythm, no murmurs, no peripheral edema, pedal pulses 2+ GI: Soft, no masses or hepatosplenomegaly, tender to palpation RUQ Skin: No rashes, palpation reveals normal turgor Psychiatric: appropriate judgment and insight, oriented to person, place, and time   Lab Results:  Recent Labs    11/10/17 0826 11/11/17 0452  WBC 8.9 7.0  HGB 14.9 12.7  HCT 45.3 38.4  PLT 315 244   BMET Recent Labs    11/10/17 0826 11/11/17 0452  NA 140 141  K 3.6 3.5  CL 107 107  CO2 22 26  GLUCOSE 130* 100*  BUN 8 5*  CREATININE 0.64 0.73  CALCIUM 9.4 8.6*   PT/INR Recent Labs    11/10/17 1551  LABPROT 14.8  INR 1.17   ABG No results for input(s): PHART, HCO3 in the last 72 hours.  Invalid input(s): PCO2, PO2  Studies/Results: Ct Abdomen Pelvis W Contrast  Result Date: 11/10/2017 CLINICAL DATA:  Right upper quadrant pain.  Suspect cholecystitis. EXAM: CT ABDOMEN AND PELVIS WITH CONTRAST TECHNIQUE: Multidetector CT imaging of the abdomen and pelvis was performed using the standard protocol following bolus administration of intravenous contrast. CONTRAST:  100mL  OMNIPAQUE IOHEXOL 300 MG/ML  SOLN COMPARISON:  None. FINDINGS: Lower chest: No acute abnormality. Hepatobiliary: Heterogeneous attenuation of the liver without focal suspicious abnormality. There is diffuse enhancement of the gallbladder wall with mild gallbladder wall edema. No stones identified. No biliary ductal dilatation identified. Pancreas: Unremarkable. No pancreatic ductal dilatation or surrounding inflammatory changes. Spleen: Normal in size without focal abnormality. Adrenals/Urinary Tract: The adrenal glands are unremarkable. No kidney mass or hydronephrosis identified. The urinary bladder appears normal. Stomach/Bowel: Stomach and the small bowel loops have a normal course and caliber. The appendix is visualized and appears normal. No pathologic dilatation of the colon. Vascular/Lymphatic: Normal appearance of the abdominal aorta. No abdominopelvic adenopathy. Reproductive: The uterus and adnexal structures are unremarkable. Other: Small amount of free fluid noted within the pelvis. Musculoskeletal: No acute or significant osseous findings. IMPRESSION: 1. Gallbladder wall enhancement and mild edema is noted. Although nonspecific findings may reflect cholecystitis. Advise further evaluation with gallbladder sonogram. 2. Heterogeneous attenuation throughout the liver which may be seen with hepatitis. No bile duct dilatation or suspicious mass identified. Electronically Signed   By: Signa Kellaylor  Stroud M.D.   On: 11/10/2017 12:04   [place] Abdomen Limited  Result Date: 11/10/2017 CLINICAL DATA:  Right upper quadrant pain for 1 week EXAM: ULTRASOUND ABDOMEN LIMITED RIGHT UPPER QUADRANT COMPARISON:  CT abdomen pelvis 11/10/2017 FINDINGS: Gallbladder: There is a 4 mm calculus within the gallbladder. A negative sonographic Eulah PontMurphy sign was reported by the sonographer. No pericholecystic fluid or gallbladder wall thickening. Common bile duct: Diameter: 3  mm Liver: Heterogeneous hepatic echotexture, as demonstrated the  concomitant CT. Portal vein is patent on color Doppler imaging with normal direction of blood flow towards the liver. IMPRESSION: 1. Cholelithiasis without other evidence of acute cholecystitis. 2. Heterogeneous hepatic echogenicity, which is nonspecific. Electronically Signed   By: Deatra Robinson M.D.   On: 11/10/2017 14:36    Anti-infectives: Anti-infectives (From admission, onward)   None      Assessment/Plan: Aimee Lopez with acute cholecystitis 1. To OR today for lap chole +/- IOC 2. All risks and benefits were discussed with the patient to generally include: infection, bleeding, possible need for post op ERCP, damage to the bile ducts, and bile leak. Alternatives were offered and described.  All questions were answered and the patient voiced understanding of the procedure and wishes to proceed at this point with a laparoscopic cholecystectomy   LOS: 1 day    Marigene Ehlers., Jed Limerick 11/11/2017

## 2017-11-11 NOTE — Progress Notes (Signed)
Family Medicine Teaching Service Daily Progress Note Intern Pager: [phone]  Patient name: Aimee Lopez Medical record number: [id number] Date of birth: Jul 15, 1995 Age: 22 y.o. Gender: female  Primary Care Provider: No primary care provider on file.  Code Status: full Consultations: gen surg, GI Admission date: 8/6  Pt Overview and Major Events to Date:  1 week of URQ and epigastric abdominal pain exacerbated with greasy food. 1 day of vomiting.   Subjective: Overnight: no acute events This morning: Patient states she is feeling good.  Admits to continued nausea no vomiting.  She endorses continued abdominal pain in upper quadrants.  She is urinating frequently.  Has not had a bowel movement. Patient is not breast-feeding. Gave patient Zofran for nausea.  Objective: Temp:  [97.9 F (36.6 C)-98.5 F (36.9 C)] 97.9 F (36.6 C) (08/06 0518) Pulse Rate:  [49-93] 60 (08/06 0518) Resp:  [14-18] 17 (08/06 0518) BP: (101-119)/(62-76) 107/76 (08/06 0518) SpO2:  [98 %-100 %] 100 % (08/06 0518) Weight:  [168 lb 8 oz (76.4 kg)] 168 lb 8 oz (76.4 kg) (08/05 1957)  Physical Exam: General: Appears nontoxic and in no acute distress. Cardiovascular: RRR, no murmmer Respiratory: CTAB Abdomen: soft, non-distended, no masses. Patient grimaces and endorses pain when palpated over upper quadrants of abdomen Extremities: no lower extremity swelling  Assessment and Plan:  cholelithiasis - cholecystectomy today -GI recommends following up on non specific liver findings out patient -Zofran PRN for nausea - pain medications s/p surgery dilaudid  Liver abnormality found on imaging -albumin 3.1 -Hep A, B, C IgM negative -Hep C Ab neg -AST 201 -ALT 452 -INR 1.17 -PTT 14.8 -GI consulted and reviewed patient, recommended patient follow up OP  Fluids/diet: NPO prior to surgery, 16500mL/hr NS S/p surgery- increase diet as tolerated PPx: heparin  Disposition:  stable  Laboratory: Recent Labs  Lab 11/10/17 0826 11/11/17 0452  WBC 8.9 7.0  HGB 14.9 12.7  HCT 45.3 38.4  PLT 315 244   Recent Labs  Lab 11/10/17 0826 11/11/17 0452  NA 140 141  K 3.6 3.5  CL 107 107  CO2 22 26  BUN 8 5*  CREATININE 0.64 0.73  CALCIUM 9.4 8.6*  PROT 7.7 5.7*  BILITOT 2.2* 1.1  ALKPHOS 116 104  ALT 459* 452*  AST 488* 201*  GLUCOSE 130* 100*   Hep A, B, C IgM neg Hep C Ab neg Albumin 3.1 Total bili 1.1 A1c 5.1  Imaging/Diagnostic Tests: US abd= 1. Cholelithiasis without other evidence of acute cholecystitis. 2. Heterogeneous hepatic echogenicity, which is nonspecific.  CT abd= 1. Gallbladder wall enhancement and mild edema is noted. Although nonspecific findings may reflect cholecystitis. Advise further evaluation with gallbladder sonogram. 2. Heterogeneous attenuation throughout the liver which may be seen with hepatitis. No bile duct dilatation or suspicious mass identified.  Leeroy BockAnderson, Chante Mayson L, DO 11/11/2017, 6:36 AM PGY-1, [facility] FPTS Intern pager: [phone], text pages welcome

## 2017-11-11 NOTE — Transfer of Care (Signed)
Immediate Anesthesia Transfer of Care Note  Patient: Aimee OilerGloria Ruiz Guzman  Procedure(s) Performed: LAPAROSCOPIC CHOLECYSTECTOMY (N/A Abdomen)  Patient Location: PACU  Anesthesia Type:General  Level of Consciousness: awake, alert  and patient cooperative  Airway & Oxygen Therapy: Patient Spontanous Breathing and Patient connected to nasal cannula oxygen  Post-op Assessment: Report given to RN and Post -op Vital signs reviewed and stable  Post vital signs: Reviewed and stable  Last Vitals:  Vitals Value Taken Time  BP 130/82 11/11/2017  1:28 PM  Temp    Pulse 92 11/11/2017  1:29 PM  Resp 11 11/11/2017  1:29 PM  SpO2 100 % 11/11/2017  1:29 PM  Vitals shown include unvalidated device data.  Last Pain:  Vitals:   11/11/17 0851  TempSrc:   PainSc: 2          Complications: No apparent anesthesia complications

## 2017-11-11 NOTE — Telephone Encounter (Signed)
The pt has been advised of the appt with Victorino DikeJennifer

## 2017-11-11 NOTE — Op Note (Signed)
11/11/2017  1:14 PM  PATIENT:  Aimee Lopez  22 y.o. female  PRE-OPERATIVE DIAGNOSIS:  Acute cholecystitis  POST-OPERATIVE DIAGNOSIS:  Acute cholecystitis  PROCEDURE:  Procedure(s): LAPAROSCOPIC CHOLECYSTECTOMY (N/A)  SURGEON:  Surgeon(s) and Role:    Axel Filler* Tanya Marvin, MD - Primary  Assistant: Bailey MechLiz Simaan, PA-C  ANESTHESIA:   local and general  EBL:  20 mL   BLOOD ADMINISTERED:none  DRAINS: none   LOCAL MEDICATIONS USED:  BUPIVICAINE   SPECIMEN:  Source of Specimen:  gallbladder  DISPOSITION OF SPECIMEN:  PATHOLOGY  COUNTS:  YES  TOURNIQUET:  * No tourniquets in log *  DICTATION: .Dragon Dictation The patient was taken to the operating and placed in the supine position with bilateral SCDs in place.  The patient was prepped and draped in the usual sterile fashion. A time out was called and all facts were verified. A pneumoperitoneum was obtained via A Veress needle technique to a pressure of 14mm of mercury.  A 5mm trochar was then placed in the right upper quadrant under visualization, and there were no injuries to any abdominal organs. A 11 mm port was then placed in the umbilical region after infiltrating with local anesthesia under direct visualization. A second and third epigastric port and right lower quadrant port placement under direct visualization, respectively.   The gallbladder was identified and retracted, the peritoneum was then sharply dissected from the gallbladder and this dissection was carried down to Calot's triangle. The gallbladder was identified and stripped away circumferentially and seen going into the gallbladder 360, the critical angle was obtained.  2 clips were placed proximally one distally and the cystic duct transected. The cystic artery was identified and 2 clips placed proximally and one distally and transected.  We then proceeded to remove the gallbladder off the hepatic fossa with Bovie cautery. A retrieval bag was then placed in the  abdomen and gallbladder placed in the bag. The hepatic fossa was then reexamined and hemostasis was achieved with Bovie cautery and was excellent at the end of the case.   The subhepatic fossa and perihepatic fossa was then irrigated until the effluent was clear.  The gallbladder and bag were removed from the abdominal cavity. The 11 mm trocar fascia was reapproximated with the Endo Close #1 Vicryl x1.  The pneumoperitoneum was evacuated and all trochars removed under direct visulalization.  The skin was then closed with 4-0 Monocryl and the skin dressed with Dermabond.    The patient was awaken from general anesthesia and taken to the recovery room in stable condition.   PLAN OF CARE: Admit for overnight observation  PATIENT DISPOSITION:  PACU - hemodynamically stable.   Delay start of Pharmacological VTE agent (>24hrs) due to surgical blood loss or risk of bleeding: not applicable

## 2017-11-11 NOTE — Anesthesia Procedure Notes (Signed)
Procedure Name: Intubation Date/Time: 11/11/2017 12:22 PM Performed by: White, Amedeo Plenty, CRNA Pre-anesthesia Checklist: Patient identified, Emergency Drugs available, Suction available and Patient being monitored Patient Re-evaluated:Patient Re-evaluated prior to induction Oxygen Delivery Method: Circle System Utilized Preoxygenation: Pre-oxygenation with 100% oxygen Induction Type: IV induction Ventilation: Mask ventilation without difficulty Laryngoscope Size: Mac and 3 Grade View: Grade I Tube type: Oral Tube size: 7.0 mm Number of attempts: 1 Airway Equipment and Method: Stylet and Oral airway Placement Confirmation: ETT inserted through vocal cords under direct vision,  positive ETCO2 and breath sounds checked- equal and bilateral Secured at: 21 cm Tube secured with: Tape Dental Injury: Teeth and Oropharynx as per pre-operative assessment

## 2017-11-11 NOTE — Progress Notes (Signed)
As plans are for lap cholecystectomy today we will not be seeing the patient during this hospital stay unless we are consulted by surgery for possible positive IOC or other concern.  Please call and consult us if we are needed at all.  Thank you, Hyacinth MeekerJennifer Lemmon, PA-C [facility]

## 2017-11-11 NOTE — Anesthesia Preprocedure Evaluation (Signed)
Anesthesia Evaluation  Patient identified by MRN, date of birth, ID band Patient awake    Reviewed: Allergy & Precautions, NPO status , Patient's Chart, lab work & pertinent test results  Airway Mallampati: I  TM Distance: >3 FB Neck ROM: Full    Dental   Pulmonary Current Smoker,    Pulmonary exam normal        Cardiovascular Normal cardiovascular exam     Neuro/Psych    GI/Hepatic   Endo/Other    Renal/GU      Musculoskeletal   Abdominal   Peds  Hematology   Anesthesia Other Findings   Reproductive/Obstetrics                             Anesthesia Physical Anesthesia Plan  ASA: II  Anesthesia Plan: General   Post-op Pain Management:    Induction: Intravenous  PONV Risk Score and Plan: 2 and Ondansetron, Dexamethasone, Midazolam and Treatment may vary due to age or medical condition  Airway Management Planned: Oral ETT  Additional Equipment:   Intra-op Plan:   Post-operative Plan: Extubation in OR  Informed Consent: I have reviewed the patients History and Physical, chart, labs and discussed the procedure including the risks, benefits and alternatives for the proposed anesthesia with the patient or authorized representative who has indicated his/her understanding and acceptance.     Plan Discussed with: CRNA and Surgeon  Anesthesia Plan Comments:         Anesthesia Quick Evaluation

## 2017-11-12 ENCOUNTER — Encounter (HOSPITAL_COMMUNITY): Payer: Self-pay | Admitting: General Surgery

## 2017-11-12 DIAGNOSIS — R112 Nausea with vomiting, unspecified: Secondary | ICD-10-CM

## 2017-11-12 LAB — CBC
HEMATOCRIT: 41.1 % (ref 36.0–46.0)
HEMOGLOBIN: 13.8 g/dL (ref 12.0–15.0)
MCH: 30.3 pg (ref 26.0–34.0)
MCHC: 33.6 g/dL (ref 30.0–36.0)
MCV: 90.3 fL (ref 78.0–100.0)
Platelets: 296 10*3/uL (ref 150–400)
RBC: 4.55 MIL/uL (ref 3.87–5.11)
RDW: 13.4 % (ref 11.5–15.5)
WBC: 11.5 10*3/uL — AB (ref 4.0–10.5)

## 2017-11-12 LAB — COMPREHENSIVE METABOLIC PANEL
ALBUMIN: 3.3 g/dL — AB (ref 3.5–5.0)
ALK PHOS: 116 U/L (ref 38–126)
ALT: 282 U/L — ABNORMAL HIGH (ref 0–44)
AST: 77 U/L — AB (ref 15–41)
Anion gap: 9 (ref 5–15)
BILIRUBIN TOTAL: 0.8 mg/dL (ref 0.3–1.2)
CALCIUM: 8.7 mg/dL — AB (ref 8.9–10.3)
CO2: 23 mmol/L (ref 22–32)
Chloride: 105 mmol/L (ref 98–111)
Creatinine, Ser: 0.64 mg/dL (ref 0.44–1.00)
GFR calc Af Amer: >60 mL/min (ref >60)
GFR calc non Af Amer: >60 mL/min (ref >60)
GLUCOSE: 94 mg/dL (ref 70–99)
POTASSIUM: 3.4 mmol/L — AB (ref 3.5–5.1)
Sodium: 137 mmol/L (ref 135–145)
TOTAL PROTEIN: 6.4 g/dL — AB (ref 6.5–8.1)

## 2017-11-12 MED ORDER — DOCUSATE SODIUM 100 MG PO CAPS: 100.0000 mg | ORAL_CAPSULE | Freq: Two times a day (BID) | ORAL | 0 refills | Status: DC | PRN

## 2017-11-12 MED ORDER — ENOXAPARIN SODIUM 40 MG/0.4ML ~~LOC~~ SOLN
40.0000 mg | SUBCUTANEOUS | Status: DC
  Administered 2017-11-12: 40 mg via SUBCUTANEOUS
  Filled 2017-11-12: qty 0.4

## 2017-11-12 MED ORDER — POLYETHYLENE GLYCOL 3350 17 G PO PACK
17.0000 g | PACK | Freq: Once | ORAL | Status: AC
  Administered 2017-11-12: 17 g via ORAL
  Filled 2017-11-12: qty 1

## 2017-11-12 MED ORDER — OXYCODONE HCL 5 MG PO TABS: 5.0000 mg | ORAL_TABLET | Freq: Four times a day (QID) | ORAL | 0 refills | Status: DC | PRN

## 2017-11-12 MED ORDER — DIPHENHYDRAMINE HCL 25 MG PO CAPS
25.0000 mg | ORAL_CAPSULE | Freq: Three times a day (TID) | ORAL | Status: DC | PRN
  Administered 2017-11-12: 25 mg via ORAL
  Filled 2017-11-12: qty 1

## 2017-11-12 NOTE — Discharge Summary (Signed)
Family Lopez Teaching Aimee Lopez Discharge Summary  Patient name: Aimee Lopez Medical record number: [id number] Date of birth: 12-19-1995 Age: 22 y.o. Gender: female Date of Admission: 11/10/2017  Date of Discharge: 8/7 Admitting Physician: Aimee Ramp, MD  Primary Care Provider: No primary care provider on file. Consultations: GS  Indication for Hospitalization: Acute cholecystitis with nausea and vomiting  Discharge Diagnoses/Problem List:   Acute cholecystitis with calculus -Calculus seen on ultrasound -Gallbladder inflammation seen on CT -General Lopez consulted -Laparoscopic cholecystectomy on 8/6 no complications -incisions healing noninfected -Patient received oxycodone from Lopez and cleared for discharge  Transaminitis -Improved -Abdominal ultrasound and CT showed nonspecific liver abnormalities -On admission AST 488, ALT 459 -Now AST 77, ALT 282 -Hepatitis panel negative -PT/INR normal -Follow-up outpatient for resolution -Consult GI for remain elevated outpatient  Nausea -Resolved -Given Zofran  Disposition: Cleared for home  Discharge Condition: Stable  Discharge Exam: Resting comfortably, able to ambulate without appearing to be in pain Laparoscopic incisions healing not draining or appearing infected.  No lower extremity swelling  Brief Hospital Course:  Presented with right upper quadrant pain exacerbated by greasy food.  Associated with several episodes of vomiting and nausea.  Ultrasound showed gallbladder calculus.  CT showed gallbladder inflammation.  Patient received cholecystectomy on 8/6 without complication.  Transaminitis improved throughout stay.  Patient cleared for discharge.  Issues for Follow Up:  1. Follow-up with Lopez 2. Diet modification 3. Recheck CMP monitor liver panel 4. Consult GI if liver panel is abnormal  Significant Procedures: Cholecystectomy 8/6  Significant Labs and Imaging:  Recent Labs  Lab  11/10/17 0826 11/11/17 0452 11/12/17 0553  WBC 8.9 7.0 11.5*  HGB 14.9 12.7 13.8  HCT 45.3 38.4 41.1  PLT 315 244 296   Recent Labs  Lab 11/10/17 0826 11/11/17 0452 11/12/17 0553  NA 140 141 137  K 3.6 3.5 3.4*  CL 107 107 105  CO2 22 26 23   GLUCOSE 130* 100* 94  BUN 8 5* <5*  CREATININE 0.64 0.73 0.64  CALCIUM 9.4 8.6* 8.7*  ALKPHOS 116 104 116  AST 488* 201* 77*  ALT 459* 452* 282*  ALBUMIN 3.9 3.1* 3.3*   Hepatitis panel negative  Results/Tests Pending at Time of Discharge: None  Discharge Medications:  Allergies as of 11/12/2017   No Known Allergies     Medication List    STOP taking these medications   ACCU-CHEK FASTCLIX LANCETS Misc   glucose blood test strip Commonly known as:  ACCU-CHEK GUIDE   medroxyPROGESTERone 10 MG tablet Commonly known as:  PROVERA   norethindrone 0.35 MG tablet Commonly known as:  MICRONOR,CAMILA,ERRIN   oxyCODONE 5 MG immediate release tablet Commonly known as:  Oxy IR/ROXICODONE   PREPLUS 27-1 MG Tabs     TAKE these medications   docusate sodium 100 MG capsule Commonly known as:  COLACE Take 1 capsule (100 mg total) by mouth 2 (two) times daily as needed for mild constipation.   ondansetron 4 MG tablet Commonly known as:  ZOFRAN Take 1 tablet (4 mg total) by mouth every 8 (eight) hours as needed for nausea or vomiting.       Discharge Instructions: Please refer to Patient Instructions section of EMR for full details.  Patient was counseled important signs and symptoms that should prompt return to medical care, changes in medications, dietary instructions, activity restrictions, and follow up appointments.   Follow-Up Appointments: Follow-up Information    Aimee Lopez.   Specialty:  Lopez Why:  office will call you for follow up next week. Contact information: [address] [place] [phone]       Aimee Lopez, [place]. Go on 11/25/2017.    Specialty:  General Lopez Why:  at 10:30 AM for post-operative follow up. please arrive 30 minutes early. Contact information: [address] [place] [phone]          Aimee Lopez 11/12/2017, 12:41 PM PGY-1, [facility]

## 2017-11-12 NOTE — Progress Notes (Signed)
[facility]/Trauma Progress Note  1 Day Post-Op   Assessment/Plan Active Problems:   Abdominal pain   Calculus of gallbladder and bile duct without cholecystitis or obstruction   Transaminitis   Calculus of gallbladder with acute cholecystitis without obstruction   Non-intractable vomiting with nausea  Acute cholecystitis with elevated LFT's - S/P lap chole, Dr. Derrell Lolling, 08/06 - LFT's trending down, Tbili normal   FEN: heart health VTE: SCD's, lovenox ID: none according to EMR Foley:  none Follow up: 2 weeks [facility] clinic  DISPO: Okay for discharge from a surgical standpoint.     LOS: 2 days    Subjective: CC; mild abdominal pain  Used stratus interpreter. Pt is tolerating diet, no nausea or vomiting, fever or chills, urinating without issues, has walked and is having flatus. No issues overnight. Needs work note. She works in a factory.   Objective: Vital signs in last 24 hours: Temp:  [98.1 F (36.7 C)-98.7 F (37.1 C)] 98.7 F (37.1 C) (08/07 0556) Pulse Rate:  [52-86] 63 (08/07 0556) Resp:  [12-18] 18 (08/06 1451) BP: (107-130)/(69-82) 107/69 (08/07 0556) SpO2:  [94 %-99 %] 99 % (08/07 0556) Last BM Date: 11/12/17  Intake/Output from previous day: 08/06 0701 - 08/07 0700 In: 2066.2 [P.O.:610; I.V.:1456.2] Out: 20 [Blood:20] Intake/Output this shift: No intake/output data recorded.  PE: Gen:  Alert, NAD, pleasant, cooperative Card:  RRR, no M/G/R heard Pulm:  Rate and effort normal Abd: Soft, not distended, +BS, incisions with glue intact and without drainage or surrounding erythema. Mild TTP without guarding. No signs of peritonitis.  Skin: no rashes noted, warm and dry   Anti-infectives: Anti-infectives (From admission, onward)   None      Lab Results:  Recent Labs    11/11/17 0452 11/12/17 0553  WBC 7.0 11.5*  HGB 12.7 13.8  HCT 38.4 41.1  PLT 244 296   BMET Recent Labs    11/11/17 0452 11/12/17 0553  NA 141 137  K 3.5  3.4*  CL 107 105  CO2 26 23  GLUCOSE 100* 94  BUN 5* <5*  CREATININE 0.73 0.64  CALCIUM 8.6* 8.7*   PT/INR Recent Labs    11/10/17 1551  LABPROT 14.8  INR 1.17   CMP     Component Value Date/Time   NA 137 11/12/2017 0553   NA 138 12/12/2016 1335   K 3.4 (L) 11/12/2017 0553   CL 105 11/12/2017 0553   CO2 23 11/12/2017 0553   GLUCOSE 94 11/12/2017 0553   BUN <5 (L) 11/12/2017 0553   BUN 9 12/12/2016 1335   CREATININE 0.64 11/12/2017 0553   CALCIUM 8.7 (L) 11/12/2017 0553   PROT 6.4 (L) 11/12/2017 0553   PROT 6.7 12/12/2016 1335   ALBUMIN 3.3 (L) 11/12/2017 0553   ALBUMIN 3.6 12/12/2016 1335   AST 77 (H) 11/12/2017 0553   ALT 282 (H) 11/12/2017 0553   ALKPHOS 116 11/12/2017 0553   BILITOT 0.8 11/12/2017 0553   BILITOT 0.2 12/12/2016 1335   GFRNONAA >60 11/12/2017 0553   GFRAA >60 11/12/2017 0553   Lipase     Component Value Date/Time   LIPASE 43 11/10/2017 0826    Studies/Results: Ct Abdomen Pelvis W Contrast  Result Date: 11/10/2017 CLINICAL DATA:  Right upper quadrant pain.  Suspect cholecystitis. EXAM: CT ABDOMEN AND PELVIS WITH CONTRAST TECHNIQUE: Multidetector CT imaging of the abdomen and pelvis was performed using the standard protocol following bolus administration of intravenous contrast. CONTRAST:  OMNIPAQUE IOHEXOL 300 MG/ML  SOLN COMPARISON:  None. FINDINGS: Lower chest: No acute abnormality. Hepatobiliary: Heterogeneous attenuation of the liver without focal suspicious abnormality. There is diffuse enhancement of the gallbladder wall with mild gallbladder wall edema. No stones identified. No biliary ductal dilatation identified. Pancreas: Unremarkable. No pancreatic ductal dilatation or surrounding inflammatory changes. Spleen: Normal in size without focal abnormality. Adrenals/Urinary Tract: The adrenal glands are unremarkable. No kidney mass or hydronephrosis identified. The urinary bladder appears normal. Stomach/Bowel: Stomach and the small bowel  loops have a normal course and caliber. The appendix is visualized and appears normal. No pathologic dilatation of the colon. Vascular/Lymphatic: Normal appearance of the abdominal aorta. No abdominopelvic adenopathy. Reproductive: The uterus and adnexal structures are unremarkable. Other: Small amount of free fluid noted within the pelvis. Musculoskeletal: No acute or significant osseous findings. IMPRESSION: 1. Gallbladder wall enhancement and mild edema is noted. Although nonspecific findings may reflect cholecystitis. Advise further evaluation with gallbladder sonogram. 2. Heterogeneous attenuation throughout the liver which may be seen with hepatitis. No bile duct dilatation or suspicious mass identified. Electronically Signed   By: Signa Kellaylor  Stroud M.D.   On: 11/10/2017 12:04   [place] Abdomen Limited  Result Date: 11/10/2017 CLINICAL DATA:  Right upper quadrant pain for 1 week EXAM: ULTRASOUND ABDOMEN LIMITED RIGHT UPPER QUADRANT COMPARISON:  CT abdomen pelvis 11/10/2017 FINDINGS: Gallbladder: There is a 4 mm calculus within the gallbladder. A negative sonographic Eulah PontMurphy sign was reported by the sonographer. No pericholecystic fluid or gallbladder wall thickening. Common bile duct: Diameter: 3 mm Liver: Heterogeneous hepatic echotexture, as demonstrated the concomitant CT. Portal vein is patent on color Doppler imaging with normal direction of blood flow towards the liver. IMPRESSION: 1. Cholelithiasis without other evidence of acute cholecystitis. 2. Heterogeneous hepatic echogenicity, which is nonspecific. Electronically Signed   By: Deatra RobinsonKevin  Herman M.D.   On: 11/10/2017 14:36      Jerre SimonJessica L Malyah Ohlrich , [facility] 11/12/2017, 10:42 AM  Pager: [phone] Mon-Wed, Friday 7:00am-4:30pm Thurs 7am-11:30am  Consults: [phone]

## 2017-11-12 NOTE — Plan of Care (Signed)

## 2017-11-12 NOTE — Progress Notes (Signed)
Discharge instructions, RX's and follow up appt explained and provided to patient and c/g verbalized understanding. Patient left floor via wheelchair accompanied by staff no c/o pain at d/c.  Raymel Cull, Kae HellerMiranda Lynn, RN

## 2017-11-19 ENCOUNTER — Ambulatory Visit: Payer: Medicaid Other | Admitting: Physician Assistant

## 2017-11-19 ENCOUNTER — Telehealth: Payer: Self-pay

## 2017-11-19 NOTE — Telephone Encounter (Signed)
One of our spanish speaking schedulers reached out to the patient to offer a follow-up hospital appointment.  That call was not returned.

## 2018-01-25 ENCOUNTER — Inpatient Hospital Stay (HOSPITAL_COMMUNITY)
Admission: AD | Admit: 2018-01-25 | Discharge: 2018-01-25 | Disposition: A | Discharge status: Home / Self Care | Payer: Medicaid Other | Source: Ambulatory Visit | Attending: Obstetrics & Gynecology | Admitting: Obstetrics & Gynecology

## 2018-01-25 ENCOUNTER — Encounter (HOSPITAL_COMMUNITY): Payer: Self-pay

## 2018-01-25 ENCOUNTER — Inpatient Hospital Stay (HOSPITAL_COMMUNITY): Payer: Medicaid Other

## 2018-01-25 DIAGNOSIS — N760 Acute vaginitis: Secondary | ICD-10-CM | POA: Insufficient documentation

## 2018-01-25 DIAGNOSIS — R109 Unspecified abdominal pain: Secondary | ICD-10-CM

## 2018-01-25 DIAGNOSIS — F17200 Nicotine dependence, unspecified, uncomplicated: Secondary | ICD-10-CM | POA: Diagnosis not present

## 2018-01-25 DIAGNOSIS — R1012 Left upper quadrant pain: Secondary | ICD-10-CM | POA: Diagnosis present

## 2018-01-25 DIAGNOSIS — B9689 Other specified bacterial agents as the cause of diseases classified elsewhere: Secondary | ICD-10-CM | POA: Insufficient documentation

## 2018-01-25 LAB — COMPREHENSIVE METABOLIC PANEL
ALT: 22 U/L (ref 0–44)
AST: 20 U/L (ref 15–41)
Albumin: 3.8 g/dL (ref 3.5–5.0)
Alkaline Phosphatase: 70 U/L (ref 38–126)
Anion gap: 9 (ref 5–15)
BUN: 9 mg/dL (ref 6–20)
CHLORIDE: 105 mmol/L (ref 98–111)
CO2: 23 mmol/L (ref 22–32)
CREATININE: 0.76 mg/dL (ref 0.44–1.00)
Calcium: 8.8 mg/dL — ABNORMAL LOW (ref 8.9–10.3)
GFR calc Af Amer: >60 mL/min (ref >60)
Glucose, Bld: 99 mg/dL (ref 70–99)
Potassium: 3.3 mmol/L — ABNORMAL LOW (ref 3.5–5.1)
SODIUM: 137 mmol/L (ref 135–145)
Total Bilirubin: 0.6 mg/dL (ref 0.3–1.2)
Total Protein: 6.9 g/dL (ref 6.5–8.1)

## 2018-01-25 LAB — URINALYSIS, ROUTINE W REFLEX MICROSCOPIC
Bilirubin Urine: NEGATIVE
GLUCOSE, UA: NEGATIVE mg/dL
Ketones, ur: NEGATIVE mg/dL
Nitrite: NEGATIVE
PROTEIN: NEGATIVE mg/dL
Specific Gravity, Urine: 1.017 (ref 1.005–1.030)
pH: 6 (ref 5.0–8.0)

## 2018-01-25 LAB — CBC
HCT: 38.4 % (ref 36.0–46.0)
Hemoglobin: 13.4 g/dL (ref 12.0–15.0)
MCH: 30.9 pg (ref 26.0–34.0)
MCHC: 34.9 g/dL (ref 30.0–36.0)
MCV: 88.5 fL (ref 80.0–100.0)
NRBC: 0 % (ref 0.0–0.2)
PLATELETS: 281 10*3/uL (ref 150–400)
RBC: 4.34 MIL/uL (ref 3.87–5.11)
RDW: 13.1 % (ref 11.5–15.5)
WBC: 9.5 10*3/uL (ref 4.0–10.5)

## 2018-01-25 LAB — WET PREP, GENITAL
Clue Cells Wet Prep HPF POC: NONE SEEN
SPERM: NONE SEEN
Trich, Wet Prep: NONE SEEN
Yeast Wet Prep HPF POC: NONE SEEN

## 2018-01-25 LAB — HCG, QUANTITATIVE, PREGNANCY: HCG, BETA CHAIN, QUANT, S: 2660 m[IU]/mL — AB (ref <5)

## 2018-01-25 LAB — POCT PREGNANCY, URINE: Preg Test, Ur: POSITIVE — AB

## 2018-01-25 MED ORDER — METRONIDAZOLE 500 MG PO TABS: 500.0000 mg | ORAL_TABLET | Freq: Two times a day (BID) | ORAL | 0 refills | Status: DC

## 2018-01-25 NOTE — MAU Provider Note (Addendum)
Patient Aimee Lopez is a 22 y.o. G2P1011 who is not sure how far along she is here with abdominal pain. She denies abnormal discharge, vaginal bleeding, dysuria, NV or diarrhea.   She has not started care in this pregnancy yet.  History     CSN: [id number]  Arrival date and time: 01/25/18 1110   None     Chief Complaint  Patient presents with  . Possible Pregnancy  . Abdominal Pain  . Vaginal Itching   Possible Pregnancy  Associated symptoms include abdominal pain.  Abdominal Pain  This is a new problem. The current episode started in the past 7 days. The problem occurs constantly. The pain is located in the LUQ. The pain is at a severity of 7/10. Pertinent negatives include no constipation, diarrhea or frequency.  Vaginal Itching  The patient's primary symptoms include genital itching. The patient's pertinent negatives include no genital lesions. This is a new problem. The current episode started in the past 7 days. The problem occurs intermittently. The problem has been unchanged. The pain is mild. Associated symptoms include abdominal pain. Pertinent negatives include no constipation, diarrhea, flank pain, frequency or urgency.    OB History    Gravida  2   Para  1   Term  1   Preterm  0   AB  1   Living  1     SAB  1   TAB  0   Ectopic  0   Multiple  0   Live Births  1           Past Medical History:  Diagnosis Date  . Medical history non-contributory     Past Surgical History:  Procedure Laterality Date  . CESAREAN SECTION N/A 02/25/2017   Procedure: CESAREAN SECTION;  Surgeon: Levie Heritage, DO;  Location: [facility] BIRTHING SUITES;  Service: Obstetrics;  Laterality: N/A;  . CHOLECYSTECTOMY N/A 11/11/2017   Procedure: LAPAROSCOPIC CHOLECYSTECTOMY;  Surgeon: Axel Filler, MD;  Location: [facility] OR;  Service: General;  Laterality: N/A;  . NO PAST SURGERIES      Family History  Problem Relation Age of Onset  . Diabetes Mother   . Diabetes  Father   . Diabetes Maternal Aunt     Social History   Tobacco Use  . Smoking status: Current Every Day Smoker    Last attempt to quit: 06/17/2016    Years since quitting: 1.6  . Smokeless tobacco: Current User  Substance Use Topics  . Alcohol use: Yes  . Drug use: Not Currently    Types: Marijuana    Comment: Denies use since delivery 04/07/17    Allergies: No Known Allergies  Medications Prior to Admission  Medication Sig Dispense Refill Last Dose  . docusate sodium (COLACE) 100 MG capsule Take 1 capsule (100 mg total) by mouth 2 (two) times daily as needed for mild constipation. 10 capsule 0   . ondansetron (ZOFRAN) 4 MG tablet Take 1 tablet (4 mg total) by mouth every 8 (eight) hours as needed for nausea or vomiting. 12 tablet 0   . oxyCODONE (OXY IR/ROXICODONE) 5 MG immediate release tablet Take 1 tablet (5 mg total) by mouth every 6 (six) hours as needed for up to 10 doses for severe pain. 5 tablet 0     Review of Systems  Constitutional: Negative.   HENT: Negative.   Respiratory: Negative.   Cardiovascular: Negative.   Gastrointestinal: Positive for abdominal pain. Negative for constipation and diarrhea.  Genitourinary: Negative.  Negative for flank pain, frequency and urgency.  Musculoskeletal: Negative.   Neurological: Negative.    Physical Exam   Blood pressure 122/73, pulse 90, temperature 98.3 F (36.8 C), temperature source Oral, resp. rate 18, weight 80.6 kg, last menstrual period 12/18/2017, currently breastfeeding.  Physical Exam  Constitutional: She appears well-developed.  HENT:  Head: Normocephalic.  Neck: Normal range of motion.  Respiratory: Effort normal.  GI: Soft.  Musculoskeletal: Normal range of motion.  Neurological: She is alert.  Skin: Skin is warm and dry.  Bimanual exam is benign-no masses, no CMT, suprapubic tenderness. Slight tenderness over left adnexa but no fullness palpated.   MAU Course  Procedures  MDM -will do complete  work-up for ectopic rule out (CBC, CMP, Beta hcg) -wet prep: clue cells present -GC chlamydia pending -[place] with embryo and no cardiac activity on [place] most likely due to early gestational age; will order follow up viability scan. Assessment and Plan   1. Bacterial vaginosis   2. Abdominal pain    2. Patient stable for discharge with follow-up US ordered; explained to patient process for follow-up.   3. Explained BV and flagyl (how to take).   4. All questions answered; reviewed warning signs and when to return to the MAU.   Charlesetta Garibaldi Meyer Dockery 01/25/2018, 12:01 PM

## 2018-01-25 NOTE — MAU Note (Addendum)
Lower abdominal pain for 3 days, more on the left side, 7/10, intermittent, pressure  No vaginal bleeding, no vaginal discharge  Reports some vaginal burning and itching for 3 days, no increased discharge or odor  LMP 12/18/17, positive HPT

## 2018-01-25 NOTE — Discharge Instructions (Signed)
Vaginosis bacteriana (Bacterial Vaginosis) La vaginosis bacteriana es una infeccin de la vagina. Se produce cuando crece una cantidad excesiva de grmenes normales (bacterias sanas) en la vagina. Esta infeccin aumenta el riesgo de contraer otras infecciones de transmisin sexual. El tratamiento de esta infeccin puede ayudar a reducir el riesgo de otras infecciones, como:  Clamidia.  Gonorrea.  VIH.  Herpes. CUIDADOS EN EL HOGAR  Tome los medicamentos tal como se lo indic su mdico.  Finalice la prescripcin completa, aunque comience a sentirse mejor.  Comunique a sus compaeros sexuales que sufre una infeccin. Deben consultar a su mdico para iniciar un tratamiento.  Durante el tratamiento: ? Evite mantener relaciones sexuales o use preservativos de la forma correcta. ? No se haga duchas vaginales. ? No consuma alcohol a menos que el mdico lo autorice. ? No amamante a menos que el mdico la autorice.  SOLICITE AYUDA SI:  No mejora luego de 3 das de tratamiento.  Observa una secrecin (prdida) de color gris ms abundante que proviene de la vagina.  Siente ms dolor que antes.  Tiene fiebre.  ASEGRESE DE QUE:  Comprende estas instrucciones.  Controlar su afeccin.  Recibir ayuda de inmediato si no mejora o si empeora.  Esta informacin no tiene como fin reemplazar el consejo del mdico. Asegrese de hacerle al mdico cualquier pregunta que tenga. Document Released: 06/21/2008 Document Revised: 07/17/2015 Document Reviewed: 11/04/2012 Elsevier Interactive Patient Education  2017 Elsevier Inc.  

## 2018-01-26 LAB — GC/CHLAMYDIA PROBE AMP (~~LOC~~) NOT AT ARMC
Chlamydia: NEGATIVE
Neisseria Gonorrhea: NEGATIVE

## 2018-02-27 ENCOUNTER — Telehealth: Payer: Self-pay | Admitting: Student

## 2018-03-02 ENCOUNTER — Ambulatory Visit: Payer: Medicaid Other

## 2018-03-02 ENCOUNTER — Ambulatory Visit (HOSPITAL_COMMUNITY): Payer: Medicaid Other

## 2018-03-04 ENCOUNTER — Encounter: Payer: Medicaid Other | Admitting: Internal Medicine

## 2018-03-11 ENCOUNTER — Ambulatory Visit (HOSPITAL_COMMUNITY)
Admission: RE | Admit: 2018-03-11 | Discharge: 2018-03-11 | Disposition: A | Discharge status: Home / Self Care | Payer: Medicaid Other | Source: Ambulatory Visit | Attending: Student | Admitting: Student

## 2018-03-11 ENCOUNTER — Ambulatory Visit (INDEPENDENT_AMBULATORY_CARE_PROVIDER_SITE_OTHER): Payer: Medicaid Other | Admitting: *Deleted

## 2018-03-11 ENCOUNTER — Encounter: Payer: Self-pay | Admitting: Family Medicine

## 2018-03-11 ENCOUNTER — Other Ambulatory Visit (HOSPITAL_COMMUNITY): Payer: Self-pay | Admitting: Student

## 2018-03-11 DIAGNOSIS — O3680X0 Pregnancy with inconclusive fetal viability, not applicable or unspecified: Secondary | ICD-10-CM

## 2018-03-11 DIAGNOSIS — Z3A11 11 weeks gestation of pregnancy: Secondary | ICD-10-CM | POA: Insufficient documentation

## 2018-03-11 DIAGNOSIS — R109 Unspecified abdominal pain: Secondary | ICD-10-CM

## 2018-03-11 DIAGNOSIS — Z3687 Encounter for antenatal screening for uncertain dates: Secondary | ICD-10-CM | POA: Insufficient documentation

## 2018-03-11 NOTE — Progress Notes (Signed)
Here for US results - reviewed with Dr.Dove and informed patient US shows live baby at [redacted]w[redacted]d . We recommend starting prenatal care and prenatal vitamins. She voices understanding.  Linda,RN

## 2018-03-18 NOTE — Progress Notes (Signed)
I have reviewed this chart and agree with the RN/CMA assessment and management.    Anara Cowman C Courney Garrod, MD, FACOG Attending Physician, Faculty [facility]  

## 2018-04-06 ENCOUNTER — Encounter: Payer: Medicaid Other | Admitting: Family Medicine

## 2018-04-20 LAB — OB RESULTS CONSOLE GC/CHLAMYDIA
Chlamydia: NEGATIVE
Gonorrhea: NEGATIVE

## 2018-04-20 LAB — OB RESULTS CONSOLE ABO/RH: RH Type: POSITIVE

## 2018-04-20 LAB — OB RESULTS CONSOLE RPR: RPR: NONREACTIVE

## 2018-04-20 LAB — OB RESULTS CONSOLE ANTIBODY SCREEN: Antibody Screen: NEGATIVE

## 2018-04-20 LAB — OB RESULTS CONSOLE HEPATITIS B SURFACE ANTIGEN: Hepatitis B Surface Ag: NEGATIVE

## 2018-04-20 LAB — OB RESULTS CONSOLE RUBELLA ANTIBODY, IGM: Rubella: IMMUNE

## 2018-04-20 LAB — OB RESULTS CONSOLE HIV ANTIBODY (ROUTINE TESTING): HIV: NONREACTIVE

## 2018-04-22 ENCOUNTER — Other Ambulatory Visit (HOSPITAL_COMMUNITY): Payer: Self-pay | Admitting: Family Medicine

## 2018-04-22 DIAGNOSIS — Z36 Encounter for antenatal screening for chromosomal anomalies: Secondary | ICD-10-CM

## 2018-05-01 ENCOUNTER — Encounter (HOSPITAL_COMMUNITY): Payer: Self-pay

## 2018-05-06 ENCOUNTER — Encounter (HOSPITAL_COMMUNITY): Payer: Self-pay | Admitting: *Deleted

## 2018-05-08 ENCOUNTER — Ambulatory Visit (HOSPITAL_COMMUNITY)
Admission: RE | Admit: 2018-05-08 | Discharge: 2018-05-08 | Disposition: A | Discharge status: Home / Self Care | Payer: Medicaid Other | Source: Ambulatory Visit | Attending: Nurse Practitioner | Admitting: Nurse Practitioner

## 2018-05-08 ENCOUNTER — Ambulatory Visit (HOSPITAL_BASED_OUTPATIENT_CLINIC_OR_DEPARTMENT_OTHER)
Admission: RE | Admit: 2018-05-08 | Discharge: 2018-05-08 | Disposition: A | Discharge status: Home / Self Care | Payer: Medicaid Other | Source: Ambulatory Visit | Attending: Nurse Practitioner | Admitting: Nurse Practitioner

## 2018-05-08 ENCOUNTER — Encounter (HOSPITAL_COMMUNITY): Payer: Self-pay

## 2018-05-08 ENCOUNTER — Other Ambulatory Visit (HOSPITAL_COMMUNITY): Payer: Self-pay | Admitting: *Deleted

## 2018-05-08 DIAGNOSIS — Z363 Encounter for antenatal screening for malformations: Secondary | ICD-10-CM

## 2018-05-08 DIAGNOSIS — O09292 Supervision of pregnancy with other poor reproductive or obstetric history, second trimester: Secondary | ICD-10-CM

## 2018-05-08 DIAGNOSIS — O09212 Supervision of pregnancy with history of pre-term labor, second trimester: Secondary | ICD-10-CM | POA: Diagnosis not present

## 2018-05-08 DIAGNOSIS — O34219 Maternal care for unspecified type scar from previous cesarean delivery: Secondary | ICD-10-CM | POA: Insufficient documentation

## 2018-05-08 DIAGNOSIS — O99212 Obesity complicating pregnancy, second trimester: Secondary | ICD-10-CM | POA: Insufficient documentation

## 2018-05-08 DIAGNOSIS — Z3A19 19 weeks gestation of pregnancy: Secondary | ICD-10-CM | POA: Insufficient documentation

## 2018-05-08 DIAGNOSIS — Z36 Encounter for antenatal screening for chromosomal anomalies: Secondary | ICD-10-CM

## 2018-05-08 DIAGNOSIS — Z362 Encounter for other antenatal screening follow-up: Secondary | ICD-10-CM

## 2018-05-08 HISTORY — DX: Personal history of gestational diabetes: Z86.32

## 2018-06-08 ENCOUNTER — Encounter (HOSPITAL_COMMUNITY): Payer: Self-pay

## 2018-06-08 ENCOUNTER — Ambulatory Visit (HOSPITAL_COMMUNITY)
Admission: RE | Admit: 2018-06-08 | Discharge: 2018-06-08 | Disposition: A | Discharge status: Home / Self Care | Payer: Medicaid Other | Source: Ambulatory Visit | Attending: Obstetrics and Gynecology | Admitting: Obstetrics and Gynecology

## 2018-06-08 ENCOUNTER — Other Ambulatory Visit (HOSPITAL_COMMUNITY): Payer: Self-pay | Admitting: *Deleted

## 2018-06-08 ENCOUNTER — Ambulatory Visit (HOSPITAL_COMMUNITY): Payer: Medicaid Other | Admitting: *Deleted

## 2018-06-08 VITALS — BP 114/89 | HR 88 | Wt 199.4 lb

## 2018-06-08 DIAGNOSIS — O34219 Maternal care for unspecified type scar from previous cesarean delivery: Secondary | ICD-10-CM

## 2018-06-08 DIAGNOSIS — Z362 Encounter for other antenatal screening follow-up: Secondary | ICD-10-CM | POA: Diagnosis present

## 2018-06-08 DIAGNOSIS — O99212 Obesity complicating pregnancy, second trimester: Secondary | ICD-10-CM | POA: Diagnosis not present

## 2018-06-08 DIAGNOSIS — Z3A24 24 weeks gestation of pregnancy: Secondary | ICD-10-CM

## 2018-06-08 DIAGNOSIS — Z0489 Encounter for examination and observation for other specified reasons: Secondary | ICD-10-CM

## 2018-06-08 DIAGNOSIS — O24410 Gestational diabetes mellitus in pregnancy, diet controlled: Secondary | ICD-10-CM

## 2018-06-08 DIAGNOSIS — O09292 Supervision of pregnancy with other poor reproductive or obstetric history, second trimester: Secondary | ICD-10-CM

## 2018-06-08 DIAGNOSIS — IMO0002 Reserved for concepts with insufficient information to code with codable children: Secondary | ICD-10-CM

## 2018-07-06 ENCOUNTER — Encounter (HOSPITAL_COMMUNITY): Payer: Self-pay | Admitting: *Deleted

## 2018-07-06 ENCOUNTER — Other Ambulatory Visit: Payer: Self-pay

## 2018-07-06 ENCOUNTER — Ambulatory Visit (HOSPITAL_COMMUNITY)
Admission: RE | Admit: 2018-07-06 | Discharge: 2018-07-06 | Disposition: A | Discharge status: Home / Self Care | Payer: Medicaid Other | Source: Ambulatory Visit | Attending: Obstetrics and Gynecology | Admitting: Obstetrics and Gynecology

## 2018-07-06 ENCOUNTER — Ambulatory Visit (HOSPITAL_COMMUNITY): Payer: Medicaid Other | Admitting: *Deleted

## 2018-07-06 VITALS — BP 113/52 | HR 77 | Temp 97.8°F

## 2018-07-06 DIAGNOSIS — O34219 Maternal care for unspecified type scar from previous cesarean delivery: Secondary | ICD-10-CM

## 2018-07-06 DIAGNOSIS — O24410 Gestational diabetes mellitus in pregnancy, diet controlled: Secondary | ICD-10-CM | POA: Insufficient documentation

## 2018-07-06 DIAGNOSIS — O09293 Supervision of pregnancy with other poor reproductive or obstetric history, third trimester: Secondary | ICD-10-CM

## 2018-07-06 DIAGNOSIS — O99213 Obesity complicating pregnancy, third trimester: Secondary | ICD-10-CM | POA: Diagnosis not present

## 2018-07-06 DIAGNOSIS — Z8632 Personal history of gestational diabetes: Secondary | ICD-10-CM

## 2018-07-06 DIAGNOSIS — Z3A28 28 weeks gestation of pregnancy: Secondary | ICD-10-CM

## 2018-07-14 ENCOUNTER — Encounter: Payer: Self-pay | Admitting: *Deleted

## 2018-08-04 ENCOUNTER — Encounter: Payer: Self-pay | Admitting: Obstetrics and Gynecology

## 2018-08-04 DIAGNOSIS — Z789 Other specified health status: Secondary | ICD-10-CM | POA: Insufficient documentation

## 2018-08-10 ENCOUNTER — Other Ambulatory Visit: Payer: Self-pay | Admitting: Obstetrics & Gynecology

## 2018-09-04 NOTE — Patient Instructions (Signed)
Instrucciones Para Antes de la Ciruga   Su ciruga est programada para 09/21/2018  (your procedure is scheduled on) Entre por la entrada principal del [facility]  a las 1215 de la tarde -(enter through the main entrance at [facility] PM         Por favor llame al [phone] si tiene algn problema en la maana de la ciruga (please call this number if you have any problems the morning of surgery.)                  Recuerde: (Remember)  No coma alimentos. (Do not eat food (After Midnight) Desps de medianoche)    No tome lquidos claros. (Do not drink clear liquids (After Midnight) Desps de medianoche)    No use joyas, maquillaje de ojos, lpiz labial, crema para el cuerpo o esmalte de uas oscuro. (Do not wear jewelry, eye makeup, lipstick, body lotion, or dark fingernail polish). Puede usar desodorante (you may wear deodorant)    No se afeite 48 horas de su ciruga. (Do not shave 48 hours before your surgery)    No traiga objetos de valor al hospital.  [place] no se hace responsable de ninguna pertenencia, ni objetos de valor que haya trado al hospital. (Do not bring valuable to the hospital.  [place] is not responsible for any belongings or valuables brought to the hospital)   [facility] medicinas en la maana de la ciruga con un SORBITO de agua nada (take these meds the morning of surgery with a SIP of water)     Durante la ciruga no se pueden usar lentes de contacto, dentaduras o puentes. (Contacts, dentures or bridgework cannot be worn in surgery).   Si va a ser ingresado despus de la ciruga, deje la [employer] en el carro hasta que se le haya asignado una habitacin. (If you are to be admitted after surgery, leave suitcase in car until your room has been assigned.)   A los pacientes que se les d de alta el mismo da no se les permitir manejar a casa.  (Patients discharged on the day of surgery will not be  allowed to drive home)    [place] y nmero de telfono del Engineer, production. (Name and telephone number of your driver)   Instrucciones especiales N/A (Special Instructions)   Por favor, lea las hojas informativas que le entregaron. (Please read over the following fact sheets that you were given) Surgical Site Infection Prevention

## 2018-09-09 ENCOUNTER — Encounter (HOSPITAL_COMMUNITY): Payer: Self-pay | Admitting: *Deleted

## 2018-09-09 NOTE — Pre-Procedure Instructions (Signed)
Interpreter number [phone]

## 2018-09-09 NOTE — Pre-Procedure Instructions (Signed)
Pt states she has been sick since Monday.  Her son had a positive flu test on Monday.  States she spoke with someone and was told to wait until her appointment on Thursday to be seen.  Linus Orn at [facility] dept notified and following up with pt to get her seen today.

## 2018-09-16 ENCOUNTER — Other Ambulatory Visit: Payer: Self-pay

## 2018-09-16 ENCOUNTER — Encounter (HOSPITAL_COMMUNITY): Payer: Self-pay

## 2018-09-16 ENCOUNTER — Inpatient Hospital Stay (HOSPITAL_COMMUNITY)
Admission: AD | Admit: 2018-09-16 | Discharge: 2018-09-16 | Disposition: A | Discharge status: Home / Self Care | Payer: Medicaid Other | Attending: Obstetrics and Gynecology | Admitting: Obstetrics and Gynecology

## 2018-09-16 DIAGNOSIS — Z3A38 38 weeks gestation of pregnancy: Secondary | ICD-10-CM | POA: Diagnosis not present

## 2018-09-16 DIAGNOSIS — O479 False labor, unspecified: Secondary | ICD-10-CM

## 2018-09-16 DIAGNOSIS — O471 False labor at or after 37 completed weeks of gestation: Secondary | ICD-10-CM | POA: Diagnosis not present

## 2018-09-16 NOTE — MAU Note (Signed)
I have communicated with Bridgette Habermann, Student CNM  and reviewed vital signs:  Vitals:   09/16/18 2256 09/16/18 2258  BP: (!) 143/79 125/79  Pulse:  97  Resp:    Temp:    SpO2:      Vaginal exam:  Dilation: Fingertip Effacement (%): 40 Cervical Position: Posterior Station: (indeterminate) Presentation: Vertex Exam by:: TLytle RN ,   Also reviewed contraction pattern and that non-stress test is reactive.  It has been documented that patient is contracting every irregularly  minutes and her cervix is fingertip  not indicating active labor.  Patient denies any other complaints.  Based on this report provider has given order for discharge.  A discharge order and diagnosis entered by a provider.   Labor discharge instructions reviewed with patient.

## 2018-09-16 NOTE — MAU Note (Signed)
PT c/o of contractions since 1900 which are 5 mins apart.  Denies LOF or bleeding.+FM. Scheduled repeat c/s on 6/15.

## 2018-09-16 NOTE — Discharge Instructions (Signed)
Evaluacin de los movimientos fetales Fetal Movement Counts Introduccin Nombre del paciente: ________________________________________________ Aimee ChapmanFecha de parto estimada: ____________________ Young BerryQu es una evaluacin de los movimientos fetales?  Una evaluacin de los movimientos fetales es el registro del nmero de veces que siente que el beb se mueve durante un cierto perodo de [place]empo. Esto tambin se puede denominar recuento de patadas fetales. Una evaluacin de movimientos fetales se recomienda a todas las embarazadas. Es posible que le indiquen que comience a Development worker, communityevaluar los movimientos fetales desde la semana 28 de [place] Cityembarazo. Preste atencin cuando sienta que el beb est ms activo. Podr detectar los ciclos en que el beb duerme y est despierto. Tambin podr detectar que ciertas cosas hacen que su beb se mueva ms. Deber realizar una evaluacin de los movimientos fetales en las siguientes situaciones:  Cuando el beb est ms activo habitualmente.  A la Unisys Corporationmisma hora, todos los Paderborndas. Un buen momento para evaluar los movimientos fetales es cuando est descansando, despus de haber comido y bebido algo. Cmo debo contar los movimientos fetales? 1. Encuentre un lugar tranquilo y cmodo. Sintese o acustese de lado. 2. Anote la fecha, la hora de inicio y de finalizacin y la cantidad de movimientos que sinti entre esas dos horas. Lleve esta informacin a las visitas de control. 3. Cuente las pataditas, revoloteos, chasquidos, vueltas o pinchazos en un perodo de 2horas. Debe sentir al menos 10movimientos en 2horas. 4. Cuando sienta 10movimientos, puede dejar de contar. 5. Si no siente 10movimientos en 2horas, coma y beba algo. Luego, contine descansando y Industrial/product designercontando durante 1hora. Si siente al menos 4movimientos durante esa hora, puede dejar de contar. Comunquese con un mdico si:  Siente menos de 4movimientos en 2horas.  El beb no se mueve tanto como suele hacerlo. Fecha:  ____________ Stevan BornHora de inicio: ____________ Stevan BornHora de finalizacin: ____________ Movimientos: ____________ Franco NonesFecha: ____________ Stevan BornHora de inicio: ____________ Stevan BornHora de finalizacin: ____________ Movimientos: ____________ Franco NonesFecha: ____________ Stevan BornHora de inicio: ____________ Stevan BornHora de finalizacin: ____________ Movimientos: ____________ Franco NonesFecha: ____________ Stevan BornHora de inicio: ____________ Stevan BornHora de finalizacin: ____________ Movimientos: ____________ Franco NonesFecha: ____________ Stevan BornHora de inicio: ____________ Mammie RussianHora de finalizacin: ____________ Movimientos: ____________ Franco NonesFecha: ____________ Stevan BornHora de inicio: ____________ Mammie RussianHora de finalizacin: ____________ Movimientos: ____________ Franco NonesFecha: ____________ Stevan BornHora de inicio: ____________ Mammie RussianHora de finalizacin: ____________ Movimientos: ____________ Franco NonesFecha: ____________ Stevan BornHora de inicio: ____________ Stevan BornHora de finalizacin: ____________ Movimientos: ____________ Franco NonesFecha: ____________ Stevan BornHora de inicio: ____________ Mammie RussianHora de finalizacin: ____________ Movimientos: ____________ Esta informacin no tiene como fin reemplazar el consejo del mdico. Asegrese de hacerle al mdico cualquier pregunta que tenga. Document Released: 07/02/2007 Document Revised: 06/28/2016 Document Reviewed: 05/04/2015 Elsevier Interactive Patient Education  2019 ArvinMeritorElsevier Inc. IAC/InterActiveCorpContracciones de Braxton Hicks Braxton Hicks Contractions Las contracciones del tero pueden presentarse durante todo el [place]embarazo, [place]pero no siempre indican que la mujer est de [place]parto. Es posible que usted haya tenido contracciones de prctica llamadas "contracciones de [place] PointBraxton Hicks". A veces, se las confunde con el parto real. Qu son las contracciones de [place] JunctionBraxton Hicks? Las contracciones de FultonBraxton Hicks son espasmos que se producen en los msculos del tero antes del [place] Creekparto. A diferencia de las contracciones del parto verdadero, estas no producen el agrandamiento (la dilatacin) ni el afinamiento del cuello uterino. Hacia el final del embarazo [facility](entre las semanas  [phone]), las contracciones de Braxton Hicks pueden presentarse ms seguido y tornarse ms intensas. A veces, resulta difcil distinguirlas del parto verdadero porque pueden ser Murphy Oilmuy molestas. No debe sentirse avergonzada si concurre al hospital con falso parto. En ocasiones, la nica forma de saber si el  trabajo de parto es verdadero es que el mdico determine si hay cambios en el cuello del tero. El V[employer]om har un examen fsico y Armed forces operational officer controle las contracciones. Si usted no est de Network engineer, el examen debe indicar que el cuello uterino no est dilatado y que usted no ha roto Financial trader. Si no hay otros problemas de salud asociados con su embarazo, no habr inconvenientes si la envan a su casa con un falso parto. Es posible que las contracciones de Braxton Hicks continen hasta que se desencadene el parto verdadero. Cmo diferenciar el [place] de parto falso del verdadero Trabajo de parto verdadero  Las contracciones [place].  Las contracciones pueden tornarse muy regulares.  La molestia generalmente se siente en la parte superior del tero y se extiende hacia la zona baja del abdomen y McDonald's Corporation cintura.  Las contracciones no desaparecen cuando usted camina.  Las contracciones generalmente se hacen ms intensas y Copywriter, advertising.  El cuello uterino se dilata y se afina. Parto falso  En general, las contracciones son ms cortas y no tan intensas como las del parto verdadero.  En general, las contracciones son irregulares.  A menudo, las contracciones se sienten en la parte delantera de la parte baja del abdomen y en la ingle.  Las Physiological scientist cuando usted camina o [place] de posicin mientras est [place].  Las contracciones se vuelven ms dbiles y su duracin es menor a medida que transcurre Mirant.  En general, el cuello uterino no se dilata ni se afina. Siga estas indicaciones en su casa:   Tome los medicamentos de venta  libre y los recetados solamente como se lo haya indicado el mdico.  Contine haciendo los ejercicios habituales y siga las dems indicaciones que el mdico le d.  Coma y beba con moderacin si cree que est de parto.  Si las contracciones de KeyCorp provocan incomodidad: ? Cambie de posicin: si est acostada o descansando, camine; si est caminando, descanse. ? Sintese y descanse en una baera con agua tibia. ? Beba suficiente lquido como para mantener la orina de color amarillo plido. La deshidratacin puede provocar contracciones. ? Respire lenta y profundamente varias veces por hora.  Vaya a todas las visitas de control prenatales y de control como se lo haya indicado el mdico. Esto es importante. Comunquese con un mdico si:  Tiene fiebre.  Siente dolor constante en el abdomen. Solicite ayuda de inmediato si:  Las contracciones se intensifican, se hacen ms regulares y Industrial/product designer s.  Tiene una prdida de lquido por la vagina.  Elimina una mucosidad sanguinolenta (prdida del tapn mucoso).  Tiene una hemorragia vaginal.  Tiene un dolor en la zona lumbar que nunca tuvo antes.  Siente que la cabeza del beb empuja hacia abajo y ejerce presin en la zona plvica.  El beb no se mueve tanto como antes. Resumen  Las [employer] se presentan antes del parto se conocen como contracciones de [place], [place] parto o contracciones de Location manager.  En general, las contracciones de Braxton Hicks son ms cortas, ms dbiles, con ms tiempo entre una y [place], y menos regulares que las contracciones del parto verdadero. Las contracciones del parto verdadero se intensifican progresivamente y se tornan regulares y ms frecuentes.  Para controlar la [employer]der System producen las contracciones de [place], puede cambiar de posicin, darse un bao templado y Production assistant, radio, beber mucha agua o practicar la respiracin profunda. Esta informacin no tiene  como fin  reemplazar el consejo del mdico. Asegrese de hacerle al mdico cualquier pregunta que tenga. Document Released: 11/04/2016 Document Revised: 03/17/2017 Document Reviewed: 11/04/2016 Elsevier Interactive Patient Education  2019 ArvinMeritorElsevier Inc.

## 2018-09-16 NOTE — Patient Instructions (Signed)
Instrucciones Para Antes de la Ciruga   Su ciruga est programada para 09/21/2018  (your procedure is scheduled on) Entre por la entrada [facility]  a las 1215 [place] -(enter through the main entrance at [facility] [place], [place] el [phone] para informarnos de su llegada. (pick up phone, dial [phone] on arrival)     Por favor llame al [phone] si tiene algn problema en la maana de la ciruga (please call this number if you have any problems the morning of surgery.)                  Recuerde: (Remember)  No coma alimentos. (Do not eat food (After Midnight) Desps de medianoche)    No tome lquidos claros. (Do not drink clear liquids (After Midnight) Desps de medianoche)    No use joyas, maquillaje de ojos, lpiz labial, crema para el cuerpo o esmalte de uas oscuro. (Do not wear jewelry, eye makeup, lipstick, body lotion, or dark fingernail polish). Puede usar desodorante (you may wear deodorant)    No se afeite 48 horas de su ciruga. (Do not shave 48 hours before your surgery)    No traiga objetos de valor al hospital.  [place] no se hace responsable de ninguna pertenencia, ni objetos de valor que haya trado al hospital. (Do not bring valuable to the hospital.   is not responsible for any belongings or valuables brought to the hospital)   [facility] medicinas en la maana de la ciruga con un SORBITO de agua nada (take these meds the morning of surgery with a SIP of water)     Durante la ciruga no se pueden usar lentes de contacto, dentaduras o puentes. (Contacts, dentures or bridgework cannot be worn in surgery).   Si va a ser ingresado despus de la ciruga, deje la [employer] en el carro hasta que se le haya asignado una habitacin. (If you are to be admitted after surgery, leave suitcase in car until your room has been assigned.)   A los pacientes que se les d de alta  el mismo da no se les permitir manejar a casa.  (Patients discharged on the day of surgery will not be allowed to drive home)    [place] y nmero de telfono del Designer, television/film set. (Name and telephone number of your driver)   Instrucciones especiales N/A (Special Instructions)   Por favor, lea las hojas informativas que le entregaron. (Please read over the following fact sheets that you were given) Surgical Site Infection Prevention

## 2018-09-17 ENCOUNTER — Other Ambulatory Visit (HOSPITAL_COMMUNITY)
Admission: RE | Admit: 2018-09-17 | Discharge: 2018-09-17 | Disposition: A | Discharge status: Home / Self Care | Payer: Medicaid Other | Source: Ambulatory Visit | Attending: Obstetrics & Gynecology | Admitting: Obstetrics & Gynecology

## 2018-09-17 DIAGNOSIS — Z1159 Encounter for screening for other viral diseases: Secondary | ICD-10-CM | POA: Insufficient documentation

## 2018-09-17 NOTE — MAU Note (Signed)
Asymptomatic, swab collected without problem. 

## 2018-09-18 LAB — NOVEL CORONAVIRUS, NAA (HOSP ORDER, SEND-OUT TO REF LAB; TAT 18-24 HRS): SARS-CoV-2, NAA: NOT DETECTED

## 2018-09-21 ENCOUNTER — Encounter (HOSPITAL_COMMUNITY)
Admission: RE | Disposition: A | Discharge status: Home / Self Care | Payer: Self-pay | Source: Home / Self Care | Attending: Obstetrics & Gynecology

## 2018-09-21 ENCOUNTER — Inpatient Hospital Stay (HOSPITAL_COMMUNITY)
Admission: RE | Admit: 2018-09-21 | Discharge: 2018-09-23 | DRG: 788 | Disposition: A | Discharge status: Home / Self Care | Payer: Medicaid Other | Attending: Obstetrics & Gynecology | Admitting: Obstetrics & Gynecology

## 2018-09-21 ENCOUNTER — Inpatient Hospital Stay (HOSPITAL_COMMUNITY): Payer: Medicaid Other | Admitting: Anesthesiology

## 2018-09-21 ENCOUNTER — Encounter (HOSPITAL_COMMUNITY): Payer: Self-pay | Admitting: *Deleted

## 2018-09-21 DIAGNOSIS — O99810 Abnormal glucose complicating pregnancy: Secondary | ICD-10-CM | POA: Diagnosis present

## 2018-09-21 DIAGNOSIS — Z3A39 39 weeks gestation of pregnancy: Secondary | ICD-10-CM

## 2018-09-21 DIAGNOSIS — Z30017 Encounter for initial prescription of implantable subdermal contraceptive: Secondary | ICD-10-CM | POA: Diagnosis not present

## 2018-09-21 DIAGNOSIS — Z87891 Personal history of nicotine dependence: Secondary | ICD-10-CM | POA: Diagnosis not present

## 2018-09-21 DIAGNOSIS — O34211 Maternal care for low transverse scar from previous cesarean delivery: Secondary | ICD-10-CM | POA: Diagnosis present

## 2018-09-21 DIAGNOSIS — Z789 Other specified health status: Secondary | ICD-10-CM | POA: Diagnosis present

## 2018-09-21 DIAGNOSIS — Z98891 History of uterine scar from previous surgery: Secondary | ICD-10-CM

## 2018-09-21 LAB — TYPE AND SCREEN
ABO/RH(D): B POS
Antibody Screen: NEGATIVE

## 2018-09-21 LAB — CBC
HCT: 38 % (ref 36.0–46.0)
Hemoglobin: 13.2 g/dL (ref 12.0–15.0)
MCH: 30.3 pg (ref 26.0–34.0)
MCHC: 34.7 g/dL (ref 30.0–36.0)
MCV: 87.4 fL (ref 80.0–100.0)
Platelets: 227 10*3/uL (ref 150–400)
RBC: 4.35 MIL/uL (ref 3.87–5.11)
RDW: 13.3 % (ref 11.5–15.5)
WBC: 6.7 10*3/uL (ref 4.0–10.5)
nRBC: 0 % (ref 0.0–0.2)

## 2018-09-21 LAB — ABO/RH: ABO/RH(D): B POS

## 2018-09-21 SURGERY — Surgical Case
Anesthesia: Regional | Site: Abdomen | Wound class: Clean Contaminated

## 2018-09-21 MED ORDER — SODIUM CHLORIDE 0.9 % IR SOLN
Status: DC | PRN
  Administered 2018-09-21: 1

## 2018-09-21 MED ORDER — SIMETHICONE 80 MG PO CHEW
80.0000 mg | CHEWABLE_TABLET | ORAL | Status: DC
  Administered 2018-09-22 (×2): 80 mg via ORAL
  Filled 2018-09-21 (×2): qty 1

## 2018-09-21 MED ORDER — ONDANSETRON HCL 4 MG/2ML IJ SOLN: 4.0000 mg | Freq: Once | INTRAMUSCULAR | Status: DC | PRN

## 2018-09-21 MED ORDER — STERILE WATER FOR IRRIGATION IR SOLN
Status: DC | PRN
  Administered 2018-09-21: 1

## 2018-09-21 MED ORDER — NALOXONE HCL 0.4 MG/ML IJ SOLN: 0.4000 mg | INTRAMUSCULAR | Status: DC | PRN

## 2018-09-21 MED ORDER — ACETAMINOPHEN 500 MG PO TABS
1000.0000 mg | ORAL_TABLET | ORAL | Status: AC
  Administered 2018-09-21: 1000 mg via ORAL

## 2018-09-21 MED ORDER — SCOPOLAMINE 1 MG/3DAYS TD PT72
MEDICATED_PATCH | TRANSDERMAL | Status: AC
  Filled 2018-09-21: qty 1

## 2018-09-21 MED ORDER — NALBUPHINE HCL 10 MG/ML IJ SOLN: 5.0000 mg | Freq: Once | INTRAMUSCULAR | Status: DC | PRN

## 2018-09-21 MED ORDER — ONDANSETRON HCL 4 MG/2ML IJ SOLN
4.0000 mg | Freq: Three times a day (TID) | INTRAMUSCULAR | Status: DC | PRN
  Administered 2018-09-21: 4 mg via INTRAVENOUS
  Filled 2018-09-21: qty 2

## 2018-09-21 MED ORDER — KETOROLAC TROMETHAMINE 30 MG/ML IJ SOLN: 30.0000 mg | Freq: Once | INTRAMUSCULAR | Status: DC | PRN

## 2018-09-21 MED ORDER — METOCLOPRAMIDE HCL 5 MG/ML IJ SOLN
INTRAMUSCULAR | Status: AC
  Filled 2018-09-21: qty 2

## 2018-09-21 MED ORDER — COCONUT OIL OIL: 1.0000 "application " | TOPICAL_OIL | Status: DC | PRN

## 2018-09-21 MED ORDER — TETANUS-DIPHTH-ACELL PERTUSSIS 5-2.5-18.5 LF-MCG/0.5 IM SUSP: 0.5000 mL | Freq: Once | INTRAMUSCULAR | Status: DC

## 2018-09-21 MED ORDER — KETOROLAC TROMETHAMINE 30 MG/ML IJ SOLN: 30.0000 mg | Freq: Four times a day (QID) | INTRAMUSCULAR | Status: AC | PRN

## 2018-09-21 MED ORDER — OXYCODONE-ACETAMINOPHEN 5-325 MG PO TABS: 2.0000 | ORAL_TABLET | ORAL | Status: DC | PRN

## 2018-09-21 MED ORDER — GABAPENTIN 100 MG PO CAPS
300.0000 mg | ORAL_CAPSULE | Freq: Two times a day (BID) | ORAL | Status: DC
  Administered 2018-09-21 – 2018-09-23 (×4): 300 mg via ORAL
  Filled 2018-09-21 (×5): qty 3

## 2018-09-21 MED ORDER — SENNOSIDES-DOCUSATE SODIUM 8.6-50 MG PO TABS
2.0000 | ORAL_TABLET | ORAL | Status: DC
  Administered 2018-09-22 (×2): 2 via ORAL
  Filled 2018-09-21 (×2): qty 2

## 2018-09-21 MED ORDER — NALBUPHINE HCL 10 MG/ML IJ SOLN: 5.0000 mg | INTRAMUSCULAR | Status: DC | PRN

## 2018-09-21 MED ORDER — IBUPROFEN 800 MG PO TABS
800.0000 mg | ORAL_TABLET | Freq: Four times a day (QID) | ORAL | Status: DC
  Administered 2018-09-22 – 2018-09-23 (×5): 800 mg via ORAL
  Filled 2018-09-21 (×5): qty 1

## 2018-09-21 MED ORDER — HYDROMORPHONE HCL 1 MG/ML IJ SOLN
INTRAMUSCULAR | Status: AC
  Filled 2018-09-21: qty 1

## 2018-09-21 MED ORDER — OXYCODONE-ACETAMINOPHEN 5-325 MG PO TABS
1.0000 | ORAL_TABLET | ORAL | Status: DC | PRN
  Administered 2018-09-22 – 2018-09-23 (×3): 1 via ORAL
  Filled 2018-09-21 (×3): qty 1

## 2018-09-21 MED ORDER — SOD CITRATE-CITRIC ACID 500-334 MG/5ML PO SOLN
ORAL | Status: AC
  Filled 2018-09-21: qty 30

## 2018-09-21 MED ORDER — DEXAMETHASONE SODIUM PHOSPHATE 4 MG/ML IJ SOLN
INTRAMUSCULAR | Status: DC | PRN
  Administered 2018-09-21: 4 mg via INTRAVENOUS

## 2018-09-21 MED ORDER — FENTANYL CITRATE (PF) 100 MCG/2ML IJ SOLN
INTRAMUSCULAR | Status: DC | PRN
  Administered 2018-09-21: 35 ug via INTRAVENOUS
  Administered 2018-09-21: 50 ug via INTRAVENOUS

## 2018-09-21 MED ORDER — OXYCODONE HCL 5 MG PO TABS: 5.0000 mg | ORAL_TABLET | Freq: Once | ORAL | Status: DC | PRN

## 2018-09-21 MED ORDER — ZOLPIDEM TARTRATE 5 MG PO TABS: 5.0000 mg | ORAL_TABLET | Freq: Every evening | ORAL | Status: DC | PRN

## 2018-09-21 MED ORDER — FERROUS SULFATE 325 (65 FE) MG PO TABS
325.0000 mg | ORAL_TABLET | Freq: Two times a day (BID) | ORAL | Status: DC
  Administered 2018-09-22 – 2018-09-23 (×3): 325 mg via ORAL
  Filled 2018-09-21 (×3): qty 1

## 2018-09-21 MED ORDER — ONDANSETRON HCL 4 MG/2ML IJ SOLN
INTRAMUSCULAR | Status: DC | PRN
  Administered 2018-09-21: 4 mg via INTRAVENOUS

## 2018-09-21 MED ORDER — SODIUM CHLORIDE 0.9 % IV SOLN
INTRAVENOUS | Status: DC | PRN
  Administered 2018-09-21: 60 ug/min via INTRAVENOUS

## 2018-09-21 MED ORDER — MEPERIDINE HCL 25 MG/ML IJ SOLN: 6.2500 mg | INTRAMUSCULAR | Status: DC | PRN

## 2018-09-21 MED ORDER — WITCH HAZEL-GLYCERIN EX PADS: 1.0000 "application " | MEDICATED_PAD | CUTANEOUS | Status: DC | PRN

## 2018-09-21 MED ORDER — OXYTOCIN 40 UNITS IN NORMAL SALINE INFUSION - SIMPLE MED: 2.5000 [IU]/h | INTRAVENOUS | Status: AC

## 2018-09-21 MED ORDER — DEXAMETHASONE SODIUM PHOSPHATE 4 MG/ML IJ SOLN
INTRAMUSCULAR | Status: AC
  Filled 2018-09-21: qty 7

## 2018-09-21 MED ORDER — PHENYLEPHRINE HCL-NACL 20-0.9 MG/250ML-% IV SOLN
INTRAVENOUS | Status: AC
  Filled 2018-09-21: qty 250

## 2018-09-21 MED ORDER — HYDROMORPHONE HCL 1 MG/ML IJ SOLN
INTRAMUSCULAR | Status: AC
  Filled 2018-09-21: qty 0.5

## 2018-09-21 MED ORDER — SODIUM CHLORIDE 0.9 % IV SOLN
INTRAVENOUS | Status: AC
  Filled 2018-09-21: qty 2

## 2018-09-21 MED ORDER — DIBUCAINE (PERIANAL) 1 % EX OINT: 1.0000 "application " | TOPICAL_OINTMENT | CUTANEOUS | Status: DC | PRN

## 2018-09-21 MED ORDER — ENOXAPARIN SODIUM 40 MG/0.4ML ~~LOC~~ SOLN
40.0000 mg | SUBCUTANEOUS | Status: DC
  Administered 2018-09-22: 40 mg via SUBCUTANEOUS
  Filled 2018-09-21: qty 0.4

## 2018-09-21 MED ORDER — LACTATED RINGERS IV SOLN
INTRAVENOUS | Status: DC
  Administered 2018-09-21 (×2): via INTRAVENOUS

## 2018-09-21 MED ORDER — DIPHENHYDRAMINE HCL 25 MG PO CAPS: 25.0000 mg | ORAL_CAPSULE | ORAL | Status: DC | PRN

## 2018-09-21 MED ORDER — FENTANYL CITRATE (PF) 100 MCG/2ML IJ SOLN
INTRAMUSCULAR | Status: AC
  Filled 2018-09-21: qty 2

## 2018-09-21 MED ORDER — OXYCODONE-ACETAMINOPHEN 5-325 MG PO TABS: 1.0000 | ORAL_TABLET | ORAL | Status: DC | PRN

## 2018-09-21 MED ORDER — ONDANSETRON HCL 4 MG/2ML IJ SOLN
INTRAMUSCULAR | Status: AC
  Filled 2018-09-21: qty 2

## 2018-09-21 MED ORDER — SIMETHICONE 80 MG PO CHEW
80.0000 mg | CHEWABLE_TABLET | ORAL | Status: DC | PRN
  Administered 2018-09-22: 17:00:00 80 mg via ORAL
  Filled 2018-09-21 (×2): qty 1

## 2018-09-21 MED ORDER — MEASLES, MUMPS & RUBELLA VAC IJ SOLR: 0.5000 mL | Freq: Once | INTRAMUSCULAR | Status: DC

## 2018-09-21 MED ORDER — HYDROMORPHONE HCL 1 MG/ML IJ SOLN
0.2500 mg | INTRAMUSCULAR | Status: DC | PRN
  Administered 2018-09-21: 16:00:00 0.25 mg via INTRAVENOUS

## 2018-09-21 MED ORDER — MAGNESIUM HYDROXIDE 400 MG/5ML PO SUSP: 30.0000 mL | ORAL | Status: DC | PRN

## 2018-09-21 MED ORDER — HYDROMORPHONE HCL 1 MG/ML IJ SOLN: 1.0000 mg | INTRAMUSCULAR | Status: DC | PRN

## 2018-09-21 MED ORDER — FENTANYL CITRATE (PF) 100 MCG/2ML IJ SOLN: INTRAMUSCULAR | Status: DC | PRN

## 2018-09-21 MED ORDER — OXYTOCIN 40 UNITS IN NORMAL SALINE INFUSION - SIMPLE MED
INTRAVENOUS | Status: AC
  Filled 2018-09-21: qty 1000

## 2018-09-21 MED ORDER — OXYCODONE HCL 5 MG/5ML PO SOLN: 5.0000 mg | Freq: Once | ORAL | Status: DC | PRN

## 2018-09-21 MED ORDER — LACTATED RINGERS IV SOLN
INTRAVENOUS | Status: DC
  Administered 2018-09-21: 125 mL/h via INTRAVENOUS
  Administered 2018-09-22: 02:00:00 via INTRAVENOUS

## 2018-09-21 MED ORDER — MORPHINE SULFATE (PF) 0.5 MG/ML IJ SOLN
INTRAMUSCULAR | Status: AC
  Filled 2018-09-21: qty 10

## 2018-09-21 MED ORDER — MENTHOL 3 MG MT LOZG: 1.0000 | LOZENGE | OROMUCOSAL | Status: DC | PRN

## 2018-09-21 MED ORDER — SODIUM CHLORIDE 0.9 % IV SOLN
INTRAVENOUS | Status: DC | PRN
  Administered 2018-09-21: 15:00:00 via INTRAVENOUS

## 2018-09-21 MED ORDER — NALOXONE HCL 4 MG/10ML IJ SOLN
1.0000 ug/kg/h | INTRAVENOUS | Status: DC | PRN
  Filled 2018-09-21: qty 5

## 2018-09-21 MED ORDER — TRAMADOL HCL 50 MG PO TABS: 50.0000 mg | ORAL_TABLET | Freq: Four times a day (QID) | ORAL | Status: DC | PRN

## 2018-09-21 MED ORDER — DIPHENHYDRAMINE HCL 50 MG/ML IJ SOLN: 12.5000 mg | INTRAMUSCULAR | Status: DC | PRN

## 2018-09-21 MED ORDER — METOCLOPRAMIDE HCL 5 MG/ML IJ SOLN
INTRAMUSCULAR | Status: DC | PRN
  Administered 2018-09-21: 10 mg via INTRAVENOUS

## 2018-09-21 MED ORDER — SCOPOLAMINE 1 MG/3DAYS TD PT72: 1.0000 | MEDICATED_PATCH | Freq: Once | TRANSDERMAL | Status: DC

## 2018-09-21 MED ORDER — OXYTOCIN 10 UNIT/ML IJ SOLN
INTRAMUSCULAR | Status: DC | PRN
  Administered 2018-09-21: 40 [IU]

## 2018-09-21 MED ORDER — SCOPOLAMINE 1 MG/3DAYS TD PT72
MEDICATED_PATCH | TRANSDERMAL | Status: DC | PRN
  Administered 2018-09-21: 1 via TRANSDERMAL

## 2018-09-21 MED ORDER — SODIUM CHLORIDE 0.9 % IV SOLN
2.0000 g | INTRAVENOUS | Status: AC
  Administered 2018-09-21: 2 g via INTRAVENOUS

## 2018-09-21 MED ORDER — KETOROLAC TROMETHAMINE 30 MG/ML IJ SOLN
30.0000 mg | Freq: Four times a day (QID) | INTRAMUSCULAR | Status: AC
  Administered 2018-09-22 (×2): 30 mg via INTRAVENOUS
  Filled 2018-09-21 (×2): qty 1

## 2018-09-21 MED ORDER — PHENYLEPHRINE 40 MCG/ML (10ML) SYRINGE FOR IV PUSH (FOR BLOOD PRESSURE SUPPORT)
PREFILLED_SYRINGE | INTRAVENOUS | Status: AC
  Filled 2018-09-21: qty 10

## 2018-09-21 MED ORDER — NALBUPHINE HCL 10 MG/ML IJ SOLN
5.0000 mg | INTRAMUSCULAR | Status: DC | PRN
  Administered 2018-09-21: 5 mg via INTRAVENOUS
  Filled 2018-09-21: qty 1

## 2018-09-21 MED ORDER — SOD CITRATE-CITRIC ACID 500-334 MG/5ML PO SOLN
30.0000 mL | ORAL | Status: AC
  Administered 2018-09-21: 30 mL via ORAL

## 2018-09-21 MED ORDER — SODIUM CHLORIDE 0.9% FLUSH: 3.0000 mL | INTRAVENOUS | Status: DC | PRN

## 2018-09-21 MED ORDER — PRENATAL MULTIVITAMIN CH
1.0000 | ORAL_TABLET | Freq: Every day | ORAL | Status: DC
  Administered 2018-09-22 – 2018-09-23 (×2): 1 via ORAL
  Filled 2018-09-21 (×2): qty 1

## 2018-09-21 MED ORDER — ACETAMINOPHEN 500 MG PO TABS
ORAL_TABLET | ORAL | Status: AC
  Filled 2018-09-21: qty 2

## 2018-09-21 MED ORDER — DIPHENHYDRAMINE HCL 25 MG PO CAPS: 25.0000 mg | ORAL_CAPSULE | Freq: Four times a day (QID) | ORAL | Status: DC | PRN

## 2018-09-21 SURGICAL SUPPLY — 34 items
BENZOIN TINCTURE PRP APPL 2/3 (GAUZE/BANDAGES/DRESSINGS) ×3 IMPLANT
CHLORAPREP W/TINT 26ML (MISCELLANEOUS) ×3 IMPLANT
CLAMP CORD UMBIL (MISCELLANEOUS) IMPLANT
CLOSURE STERI STRIP 1/2 X4 (GAUZE/BANDAGES/DRESSINGS) ×3 IMPLANT
CLOTH BEACON ORANGE TIMEOUT ST (SAFETY) ×3 IMPLANT
DRSG OPSITE POSTOP 4X10 (GAUZE/BANDAGES/DRESSINGS) ×3 IMPLANT
ELECT REM PT RETURN 9FT ADLT (ELECTROSURGICAL) ×3
ELECTRODE REM PT RTRN 9FT ADLT (ELECTROSURGICAL) ×1 IMPLANT
EXTRACTOR VACUUM M CUP 4 TUBE (SUCTIONS) IMPLANT
EXTRACTOR VACUUM M CUP 4' TUBE (SUCTIONS)
GLOVE BIOGEL PI IND STRL 7.0 (GLOVE) ×3 IMPLANT
GLOVE BIOGEL PI INDICATOR 7.0 (GLOVE) ×6
GLOVE ECLIPSE 7.0 STRL STRAW (GLOVE) ×3 IMPLANT
GOWN STRL REUS W/TWL LRG LVL3 (GOWN DISPOSABLE) ×6 IMPLANT
KIT ABG SYR 3ML LUER SLIP (SYRINGE) IMPLANT
NEEDLE HYPO 22GX1.5 SAFETY (NEEDLE) ×3 IMPLANT
NEEDLE HYPO 25X5/8 SAFETYGLIDE (NEEDLE) ×3 IMPLANT
NS IRRIG 1000ML POUR BTL (IV SOLUTION) ×3 IMPLANT
PACK C SECTION WH (CUSTOM PROCEDURE TRAY) ×3 IMPLANT
PAD ABD 7.5X8 STRL (GAUZE/BANDAGES/DRESSINGS) ×3 IMPLANT
PAD OB MATERNITY 4.3X12.25 (PERSONAL CARE ITEMS) ×3 IMPLANT
PENCIL SMOKE EVAC W/HOLSTER (ELECTROSURGICAL) ×3 IMPLANT
RTRCTR C-SECT PINK 25CM LRG (MISCELLANEOUS) IMPLANT
SPONGE GAUZE 4X4 12PLY STER LF (GAUZE/BANDAGES/DRESSINGS) ×3 IMPLANT
SUT PDS AB 0 CTX 36 PDP370T (SUTURE) IMPLANT
SUT PLAIN 2 0 XLH (SUTURE) IMPLANT
SUT VIC AB 0 CTX 36 (SUTURE) ×4
SUT VIC AB 0 CTX36XBRD ANBCTRL (SUTURE) ×2 IMPLANT
SUT VIC AB 4-0 KS 27 (SUTURE) ×3 IMPLANT
SYR CONTROL 10ML LL (SYRINGE) ×3 IMPLANT
TOWEL OR 17X24 6PK STRL BLUE (TOWEL DISPOSABLE) ×3 IMPLANT
TRAY FOLEY W/BAG SLVR 14FR LF (SET/KITS/TRAYS/PACK) ×3 IMPLANT
VACUUM CUP M-STYLE MYSTIC II (SUCTIONS) ×3 IMPLANT
WATER STERILE IRR 1000ML POUR (IV SOLUTION) ×3 IMPLANT

## 2018-09-21 NOTE — Anesthesia Preprocedure Evaluation (Addendum)
Anesthesia Evaluation  Patient identified by MRN, date of birth, ID band Patient awake    Reviewed: Allergy & Precautions, NPO status , Patient's Chart, lab work & pertinent test results  Airway Mallampati: II  TM Distance: >3 FB Neck ROM: Full    Dental no notable dental hx. (+) Teeth Intact   Pulmonary former smoker,    Pulmonary exam normal breath sounds clear to auscultation       Cardiovascular Exercise Tolerance: Good negative cardio ROS Normal cardiovascular exam Rhythm:Regular Rate:Normal     Neuro/Psych negative neurological ROS  negative psych ROS   GI/Hepatic negative GI ROS, Neg liver ROS,   Endo/Other  negative endocrine ROS  Renal/GU   negative genitourinary   Musculoskeletal   Abdominal   Peds  Hematology negative hematology ROS (+)   Anesthesia Other Findings   Reproductive/Obstetrics (+) Pregnancy Prior C/S                            Anesthesia Physical Anesthesia Plan  ASA: III  Anesthesia Plan: Spinal   Post-op Pain Management:    Induction:   PONV Risk Score and Plan: Treatment may vary due to age or medical condition  Airway Management Planned: Simple Face Mask and Natural Airway  Additional Equipment:   Intra-op Plan:   Post-operative Plan:   Informed Consent: I have reviewed the patients History and Physical, chart, labs and discussed the procedure including the risks, benefits and alternatives for the proposed anesthesia with the patient or authorized representative who has indicated his/her understanding and acceptance.     Dental advisory given  Plan Discussed with:   Anesthesia Plan Comments: (Rpt C/S under Spinal, T&S)       Anesthesia Quick Evaluation

## 2018-09-21 NOTE — Op Note (Signed)
Aimee Lopez PROCEDURE DATE: 09/21/2018  PREOPERATIVE DIAGNOSES: Intrauterine pregnancy at 7448w0d weeks gestation; previous uterine incision LTCS x 1; declines trial of labor  POSTOPERATIVE DIAGNOSES: The same  PROCEDURE: Repeat Low Transverse Cesarean Section With Vacuum Assistance  SURGEON:  Dr. Jaynie CollinsUgonna   ASSISTANT:  Dr. Marcy Sirenatherine Wallace  ANESTHESIOLOGY TEAM: Anesthesiologist: Trevor IhaHouser, Stephen A, MD CRNA: Graciela HusbandsFussell, Wynn O, CRNA  INDICATIONS: Aimee Lopez is a 23 y.o. [phone] at 10948w0d here for repeat cesarean section secondary to the indications listed under preoperative diagnoses; please see preoperative note for further details.  The risks of cesarean section were discussed with the patient including but were not limited to: bleeding which may require transfusion or reoperation; infection which may require antibiotics; injury to bowel, bladder, ureters or other surrounding organs; injury to the fetus; need for additional procedures including hysterectomy in the event of a life-threatening hemorrhage; placental abnormalities wth subsequent pregnancies, incisional problems, thromboembolic phenomenon and other postoperative/anesthesia complications.   The patient concurred with the proposed plan, giving informed written consent for the procedure.    FINDINGS:  Viable female infant in cephalic presentation.  Apgars 8 and 9.  Clear amniotic fluid.  Intact placenta, three vessel cord.  Normal uterus, fallopian tubes and ovaries bilaterally. Minimal intraperitoneal adhesive disease involving vesicouterine segment being higher up on the lower uterine segment; bladder flap created to access the lower uterine segment.  Thin lower uterine segment noted.   ANESTHESIA: Spinal ESTIMATED BLOOD LOSS: 428 ml as per Triton SPECIMENS: Placenta sent to L&D COMPLICATIONS: None immediate  PROCEDURE IN DETAIL:  The patient preoperatively received intravenous antibiotics and had sequential  compression devices applied to her lower extremities.  She was then taken to the operating room where spinal anesthesia was administered and was found to be adequate. She was then placed in a dorsal supine position with a leftward tilt, and prepped and draped in a sterile manner.  A foley catheter was placed into her bladder and attached to constant gravity.  After an adequate timeout was performed, a Pfannenstiel skin incision was made with scalpel on her preexisting scar and carried through to the underlying layer of fascia. The fascia was incised in the midline, and this incision was extended bilaterally using the Mayo scissors.  Kocher clamps were applied to the superior aspect of the fascial incision and the underlying rectus muscles were dissected off bluntly and sharply.  A similar process was carried out on the inferior aspect of the fascial incision. The rectus muscles were separated in the midline and the peritoneum was entered bluntly. The Alexis self-retaining retractor was introduced into the abdominal cavity.  Attention was turned to the lower uterine segment where a bladder flap was created, and a  low transverse hysterotomy was made with a scalpel and extended bilaterally bluntly. There was some difficulty in delivering fetal head, vacuum assistance was used and it was on for about five seconds. The infant was successfully delivered, the cord was clamped and cut after one minute, and the infant was handed over to the awaiting neonatology team. Uterine massage was then administered, and the placenta delivered intact with a three-vessel cord. The uterus was then cleared of clots and debris.  The hysterotomy was closed with 0 Vicryl in a running locked fashion, and an imbricating layer was also placed with 0 Vicryl.  Figure-of-eight 0 Vicryl serosal stitches were placed to help with hemostasis.  The pelvis was cleared of all clot and debris. Hemostasis was confirmed on all surfaces.  The retractor was  removed.  The peritoneum was closed with a 0 Vicryl running stitch. The fascia was then closed using 0 PDS in a running fashion.  The subcutaneous layer was irrigated, reapproximated with 2-0 plain gut interrupted stitches, and the skin was closed with a 4-0 Vicryl subcuticular stitch. The patient tolerated the procedure well. Sponge, instrument and needle counts were correct x 3.  She was taken to the recovery room in stable condition.     Verita Schneiders, MD, [place] for [employer], [place]

## 2018-09-21 NOTE — Discharge Summary (Signed)
OB Discharge Summary     Patient Name: Aimee OilerGloria Ruiz Lopez DOB: 01-05-1996 MRN: [id number]  Date of admission: 09/21/2018 Delivering MD: Jaynie CollinsANYANWU, Dina Mobley A   Date of discharge: 09/23/2018  Admitting diagnosis: RCS Intrauterine pregnancy: 6659w0d     Secondary diagnosis:  Principal Problem:   Status post primary low transverse cesarean section Active Problems:   Language barrier   Abnormal one hour glucose tolerance test (GTT) during pregnancy, antepartum   S/P cesarean section  Additional problems: none     Discharge diagnosis: Term Pregnancy Delivered                                                                                                Post partum procedures:Nexplanon Placement  Augmentation: None  Complications: None  Hospital course:  Sceduled C/S   23 y.o. yo G3P1011 at 4159w0d was admitted to the hospital 09/21/2018 for scheduled cesarean section with the following indication:Elective Repeat.  Membrane Rupture Time/Date: 3:11 PM ,09/21/2018   Patient delivered a Viable infant.09/21/2018  Details of operation can be found in separate operative note.  Pateint had an uncomplicated postpartum course.  She is ambulating, tolerating a regular diet, passing flatus, and urinating well. Patient is discharged home in stable condition on  09/23/18         Physical exam  Vitals:   09/22/18 0940 09/22/18 1603 09/22/18 2232 09/23/18 0514  BP: (!) 102/56 (!) 105/51 119/71 117/73  Pulse: 63 74 67 60  Resp: 18 18 18 18   Temp: 98.2 F (36.8 C) 98.5 F (36.9 C) 98 F (36.7 C) 97.7 F (36.5 C)  TempSrc: Oral Oral Oral Oral  SpO2: 96%   97%  Weight:      Height:       General: alert, cooperative and no distress Lochia: appropriate Uterine Fundus: firm Incision: Healing well with no significant drainage, No significant erythema, Dressing is clean, dry, and intact DVT Evaluation: No evidence of DVT seen on physical exam. Negative Homan's sign. No cords or calf  tenderness. Labs: Lab Results  Component Value Date   WBC 11.1 (H) 09/22/2018   HGB 11.4 (L) 09/22/2018   HCT 32.3 (L) 09/22/2018   MCV 86.1 09/22/2018   PLT 207 09/22/2018   CMP Latest Ref Rng & Units 09/22/2018  Glucose 70 - 99 mg/dL -  BUN 6 - 20 mg/dL -  Creatinine 4.090.44 - 8.111.00 mg/dL 9.140.51  Sodium 782135 - 956145 mmol/L -  Potassium 3.5 - 5.1 mmol/L -  Chloride 98 - 111 mmol/L -  CO2 22 - 32 mmol/L -  Calcium 8.9 - 10.3 mg/dL -  Total Protein 6.5 - 8.1 g/dL -  Total Bilirubin 0.3 - 1.2 mg/dL -  Alkaline Phos 38 - 213126 U/L -  AST 15 - 41 U/L -  ALT 0 - 44 U/L -    Discharge instruction: per After Visit Summary and "Baby and Me Booklet".  After visit meds:  Allergies as of 09/23/2018   No Known Allergies     Medication List    TAKE these medications   acetaminophen 325 MG tablet  Commonly known as: TYLENOL Take 325 mg by mouth every 6 (six) hours as needed (for pain.).   ibuprofen 800 MG tablet Commonly known as: ADVIL Take 1 tablet (800 mg total) by mouth every 6 (six) hours.   oxyCODONE-acetaminophen 5-325 MG tablet Commonly known as: PERCOCET/ROXICET Take 1 tablet by mouth every 4 (four) hours as needed for moderate pain.   Prenatal 27-1 MG Tabs Take 1 tablet by mouth at bedtime.       Diet: routine diet  Activity: Advance as tolerated. Pelvic rest for 6 weeks.   Outpatient follow up:4 weeks Follow up Appt: Future Appointments  Date Time Provider [place]  10/05/2018  1:30 PM [place] [facility]   Follow up Visit:No follow-ups on file.  Postpartum contraception: Nexplanon placed PP   Newborn Data: Live born female  Birth Weight:  73g APGAR: 8/9  Newborn Delivery   Birth date/time: 09/21/2018 15:13:00 Delivery type: C-Section, Vacuum Assisted Trial of labor: No C-section categorization: Repeat      Baby Feeding: Bottle and Breast Disposition:home with mother   09/23/2018 Verita Schneiders, MD

## 2018-09-21 NOTE — Anesthesia Procedure Notes (Signed)
Spinal  Patient location during procedure: OB Start time: 09/21/2018 2:43 PM End time: 09/21/2018 2:48 PM Staffing Anesthesiologist: Barnet Glasgow, MD Performed: anesthesiologist  Preanesthetic Checklist Completed: patient identified, surgical consent, pre-op evaluation, timeout performed, IV checked, risks and benefits discussed and monitors and equipment checked Spinal Block Patient position: sitting Prep: site prepped and draped and DuraPrep Patient monitoring: heart rate, cardiac monitor, continuous pulse ox and blood pressure Approach: midline Location: L3-4 Injection technique: single-shot Needle Needle type: Pencan  Needle gauge: 24 G Needle length: 10 cm Needle insertion depth: 7 cm Assessment Sensory level: T4 Additional Notes 1 Attempt (s). Pt tolerated procedure well.

## 2018-09-21 NOTE — Progress Notes (Signed)
RN used in Social research officer, government.

## 2018-09-21 NOTE — Lactation Note (Addendum)
This note was copied from a baby's chart. Lactation Consultation Note  Patient Name: Aimee Lopez LDJTT'S Date: 09/21/2018  P2, 5 hour female infant. In house interpreter used # Viria per mom, she changed her mind about breastfeeding she wants to formula feed only, although she put breast and formula feeding as her choice at admission.     Maternal Data    Feeding    LATCH Score                   Interventions    Lactation Tools Discussed/Used     Consult Status      Vicente Serene 09/21/2018, 8:54 PM

## 2018-09-21 NOTE — Anesthesia Postprocedure Evaluation (Signed)
Anesthesia Post Note  Patient: Aimee Lopez  Procedure(s) Performed: CESAREAN SECTION (N/A Abdomen)     Patient location during evaluation: Mother Baby Anesthesia Type: Spinal Level of consciousness: oriented and awake and alert Pain management: pain level controlled Vital Signs Assessment: post-procedure vital signs reviewed and stable Respiratory status: spontaneous breathing and respiratory function stable Cardiovascular status: blood pressure returned to baseline and stable Postop Assessment: no headache, no backache, no apparent nausea or vomiting and able to ambulate Anesthetic complications: no    Last Vitals:  Vitals:   09/21/18 1647 09/21/18 1704  BP: 112/74 127/75  Pulse: (!) 51 62  Resp: 14 18  Temp: 36.4 C 36.4 C  SpO2: 99% 98%    Last Pain:  Vitals:   09/21/18 1704  TempSrc: Oral  PainSc:    Pain Goal: Patients Stated Pain Goal: 4 (09/21/18 1234)              Epidural/Spinal Function Cutaneous sensation: Tingles (09/21/18 1647), Patient able to flex knees: Yes (09/21/18 1647), Patient able to lift hips off bed: No (09/21/18 1647), Back pain beyond tenderness at insertion site: No (09/21/18 1647), Progressively worsening motor and/or sensory loss: No (09/21/18 1647), Bowel and/or bladder incontinence post epidural: No (09/21/18 1647)  Barnet Glasgow

## 2018-09-21 NOTE — H&P (Signed)
Obstetric Preoperative History and Physical  Aimee OilerGloria Ruiz Lopez is a 23 y.o. G3P1011 with IUP at 2120w0d presenting for scheduled repeat cesarean section.  Reports good fetal movement, no bleeding, no contractions, no leaking of fluid.  No acute preoperative concerns.    Cesarean Section Indication: Previous uterine incision LTCS x 1; declines TOLAC  Prenatal Course Source of Care: [facility] Pregnancy complications or risks: Patient Active Problem List   Diagnosis Date Noted  . Abnormal one hour glucose tolerance test (GTT) during pregnancy, antepartum 09/21/2018  . Language barrier 08/04/2018  . Status post primary low transverse cesarean section 02/25/2017   She plans to breastfeed and bottle feed She desires Nexplanon for postpartum contraception; has Medicaid. Patient gave permission for postpartum placement prior to discharge.   Prenatal labs and studies: ABO, Rh: --/--/B POS (06/15 1245) Antibody: NEG (06/15 1245) Rubella: Immune (01/13 0000) RPR: Nonreactive (01/13 0000)  HBsAg: Negative (01/13 0000)  HIV: Non-reactive (01/13 0000)  GBS: Positive 1 hr Glucola  142, normal 2 hr GTT Genetic screening normal Anatomy US normal   Past Medical History:  Diagnosis Date  . History of gestational diabetes mellitus 08/21/2016   [ ] PP 2 hour GTT    Past Surgical History:  Procedure Laterality Date  . CESAREAN SECTION N/A 02/25/2017   Procedure: CESAREAN SECTION;  Surgeon: Levie HeritageStinson, Aimee J, DO;  [facility];  Service: Obstetrics;  Laterality: N/A;  . CHOLECYSTECTOMY N/A 11/11/2017   Procedure: LAPAROSCOPIC CHOLECYSTECTOMY;  Surgeon: Aimee Lopez, Armando, MD;  Location: [facility] OR;  Service: General;  Laterality: N/A;    OB History  Gravida Para Term Preterm AB Living  3 1 1  0 1 1  SAB TAB Ectopic Multiple Live Births  1 0 0 0 1    # Outcome Date GA Lbr Len/2nd Weight Sex Delivery Anes PTL Lv  3 Current           2 Term 02/25/17 6425w1d  3425 g M CS-LTranv EPI  LIV   Birth Comments: wnl  1 SAB 2016            Social History   Socioeconomic History  . Marital status: Single    Spouse name: Not on file  . Number of children: Not on file  . Years of education: Not on file  . Highest education level: Not on file  Occupational History  . Not on file  Social Needs  . Financial resource strain: Not hard at all  . Food insecurity    Worry: Never true    Inability: Never true  . Transportation needs    Medical: Not on file    Non-medical: Not on file  Tobacco Use  . Smoking status: Former Smoker    Quit date: 06/17/2016    Years since quitting: 2.2  . Smokeless tobacco: Current User  Substance and Sexual Activity  . Alcohol use: Not Currently  . Drug use: Not Currently    Types: Marijuana    Comment: Denies use since delivery 04/07/17  . Sexual activity: Not Currently    Birth control/protection: None  Lifestyle  . Physical activity    Days per week: Not on file    Minutes per session: Not on file  . Stress: Not on file  Relationships  . Social Musicianconnections    Talks on phone: Not on file    Gets together: Not on file    Attends religious service: Not on file    Active member of club or organization: Not  on file    Attends meetings of clubs or organizations: Not on file    Relationship status: Not on file  Other Topics Concern  . Not on file  Social History Narrative  . Not on file    Family History  Problem Relation Age of Onset  . Diabetes Mother   . Diabetes Father   . Diabetes Maternal Aunt     Medications Prior to Admission  Medication Sig Dispense Refill Last Dose  . acetaminophen (TYLENOL) 325 MG tablet Take 325 mg by mouth every 6 (six) hours as needed (for pain.).     Marland Kitchen Prenatal 27-1 MG TABS Take 1 tablet by mouth at bedtime.       No Known Allergies  Review of Systems: Pertinent items noted in HPI and remainder of comprehensive ROS otherwise negative.  Physical Exam: BP 139/89   Pulse 87   Temp 98 F (36.7  C) (Oral)   Resp 18   Ht 5\' 1"  (1.549 m)   Wt 98.7 kg   LMP 12/22/2017 (Exact Date)   SpO2 99%   BMI 41.11 kg/m  FHR by Doppler: 128 bpm CONSTITUTIONAL: Well-developed, well-nourished female in no acute distress.  HENT:  Normocephalic, atraumatic, External right and left ear normal. Oropharynx is clear and moist EYES: Conjunctivae and EOM are normal. Pupils are equal, round, and reactive to light. No scleral icterus.  NECK: Normal range of motion, supple, no masses SKIN: Skin is warm and dry. No rash noted. Not diaphoretic. No erythema. No pallor. [place]: Alert and oriented to person, place, and time. Normal reflexes, muscle tone coordination. No cranial nerve deficit noted. PSYCHIATRIC: Normal mood and affect. Normal behavior. Normal judgment and thought content. CARDIOVASCULAR: Normal heart rate noted, regular rhythm RESPIRATORY: Effort and breath sounds normal, no problems with respiration noted ABDOMEN: Soft, nontender, nondistended, gravid. Well-healed Pfannenstiel incision. PELVIC: Deferred MUSCULOSKELETAL: Normal range of motion. No edema and no tenderness. 2+ distal pulses.   Pertinent Labs/Studies:   Results for orders placed or performed during the hospital encounter of 09/21/18 (from the past 72 hour(s))  Type and screen     Status: None   Collection Time: 09/21/18 12:45 PM  Result Value Ref Range   ABO/RH(D) B POS    Antibody Screen NEG    Sample Expiration      09/24/2018,2359 Performed at [facility], [place] [address], [place], [place] [address]   CBC     Status: None   Collection Time: 09/21/18 12:48 PM  Result Value Ref Range   WBC 6.7 4.0 - 10.5 K/uL   RBC 4.35 3.87 - 5.11 MIL/uL   Hemoglobin 13.2 12.0 - 15.0 g/dL   HCT 38.0 36.0 - 46.0 %   MCV 87.4 80.0 - 100.0 fL   MCH 30.3 26.0 - 34.0 pg   MCHC 34.7 30.0 - 36.0 g/dL   RDW 13.3 11.5 - 15.5 %   Platelets 227 150 - 400 K/uL   nRBC 0.0 0.0 - 0.2 %    Comment: Performed at [facility], [place] [address] [place], [place] [address]    Assessment and Plan :Aimee Lopez is a 23 y.o. G3P1011 at [redacted]w[redacted]d being admitted for repeat scheduled cesarean section. The risks of cesarean section discussed with the patient included but were not limited to: bleeding which may require transfusion or reoperation; infection which may require antibiotics; injury to bowel, bladder, ureters or other surrounding organs; injury to the fetus; need for additional procedures  including hysterectomy in the event of a life-threatening hemorrhage; placental abnormalities wth subsequent pregnancies, incisional problems, thromboembolic phenomenon and other postoperative/anesthesia complications. The patient concurred with the proposed plan, giving informed written consent for the procedure. Patient has been NPO since last night she will remain NPO for procedure. Anesthesia and OR aware. Preoperative prophylactic antibiotics and SCDs ordered on call to the OR. To OR when ready.    Jaynie CollinsUGONNA  Milan Perkins, MD, FACOG Obstetrician & Gynecologist, [facility], [facility]

## 2018-09-21 NOTE — Transfer of Care (Signed)
Immediate Anesthesia Transfer of Care Note  Patient: Aimee Lopez  Procedure(s) Performed: CESAREAN SECTION (N/A Abdomen)  Patient Location: PACU  Anesthesia Type:Spinal  Level of Consciousness: awake, alert  and oriented  Airway & Oxygen Therapy: Patient Spontanous Breathing  Post-op Assessment: Report given to RN and Post -op Vital signs reviewed and stable  Post vital signs: Reviewed and stable  Last Vitals:  Vitals Value Taken Time  BP    Temp    Pulse 58 09/21/18 1604  Resp 17 09/21/18 1604  SpO2 98 % 09/21/18 1604  Vitals shown include unvalidated device data.  Last Pain:  Vitals:   09/21/18 1234  TempSrc: Oral  PainSc: 0-No pain      Patients Stated Pain Goal: 4 (09/47/09 6283)  Complications: No apparent anesthesia complications

## 2018-09-22 ENCOUNTER — Other Ambulatory Visit: Payer: Self-pay

## 2018-09-22 ENCOUNTER — Encounter (HOSPITAL_COMMUNITY): Payer: Self-pay | Admitting: Obstetrics & Gynecology

## 2018-09-22 LAB — CBC
HCT: 32.3 % — ABNORMAL LOW (ref 36.0–46.0)
Hemoglobin: 11.4 g/dL — ABNORMAL LOW (ref 12.0–15.0)
MCH: 30.4 pg (ref 26.0–34.0)
MCHC: 35.3 g/dL (ref 30.0–36.0)
MCV: 86.1 fL (ref 80.0–100.0)
Platelets: 207 10*3/uL (ref 150–400)
RBC: 3.75 MIL/uL — ABNORMAL LOW (ref 3.87–5.11)
RDW: 13.2 % (ref 11.5–15.5)
WBC: 11.1 10*3/uL — ABNORMAL HIGH (ref 4.0–10.5)
nRBC: 0 % (ref 0.0–0.2)

## 2018-09-22 LAB — BIRTH TISSUE RECOVERY COLLECTION (PLACENTA DONATION)

## 2018-09-22 LAB — CREATININE, SERUM
Creatinine, Ser: 0.51 mg/dL (ref 0.44–1.00)
GFR calc Af Amer: >60 mL/min (ref >60)
GFR calc non Af Amer: >60 mL/min (ref >60)

## 2018-09-22 LAB — RPR: RPR Ser Ql: NONREACTIVE

## 2018-09-22 MED ORDER — AMMONIA AROMATIC IN INHA
RESPIRATORY_TRACT | Status: AC
  Administered 2018-09-22: 06:00:00
  Filled 2018-09-22: qty 10

## 2018-09-22 NOTE — Progress Notes (Addendum)
Post op Day 1 RLTCS Subjective: no complaints, up ad lib and voiding  Objective: Blood pressure (!) 102/56, pulse 63, temperature 98.2 F (36.8 C), temperature source Oral, resp. rate 18, height 5\' 1"  (1.549 m), weight 98.7 kg, last menstrual period 12/22/2017, SpO2 96 %, unknown if currently breastfeeding.  Physical Exam:  General: alert, cooperative and no distress Lochia: appropriate Uterine Fundus: firm Incision: healing well, no significant drainage, no dehiscence DVT Evaluation: No evidence of DVT seen on physical exam. Negative Homan's sign. No cords or calf tenderness. No significant calf/ankle edema.  Recent Labs    09/21/18 1248 09/22/18 0442  HGB 13.2 11.4*  HCT 38.0 32.3*    Assessment/Plan: Plan for discharge tomorrow and Breastfeeding   LOS: 1 day   Truett Mainland 09/22/2018, 12:18 PM

## 2018-09-23 MED ORDER — IBUPROFEN 800 MG PO TABS: 800.0000 mg | ORAL_TABLET | Freq: Four times a day (QID) | ORAL | 0 refills | Status: AC

## 2018-09-23 MED ORDER — LIDOCAINE HCL 1 % IJ SOLN
0.0000 mL | Freq: Once | INTRAMUSCULAR | Status: AC | PRN
  Administered 2018-09-23: 20 mL via INTRADERMAL
  Filled 2018-09-23: qty 20

## 2018-09-23 MED ORDER — ETONOGESTREL 68 MG ~~LOC~~ IMPL
68.0000 mg | DRUG_IMPLANT | Freq: Once | SUBCUTANEOUS | Status: AC
  Administered 2018-09-23: 68 mg via SUBCUTANEOUS
  Filled 2018-09-23: qty 1

## 2018-09-23 MED ORDER — OXYCODONE-ACETAMINOPHEN 5-325 MG PO TABS: 1.0000 | ORAL_TABLET | ORAL | 0 refills | Status: AC | PRN

## 2018-09-23 NOTE — Procedures (Signed)
PROCEDURE NOTE: NEXPLANON  INSERTION  PRE-OP DIAGNOSIS: desired long-term, reversible contraception POST-OP DIAGNOSIS: Same PROCEDURE: Nexplanon  placement Performing Physician: Dr. Zettie Cooley Supervising Physician (if applicable): None  PROCEDURE: Patient postpartum day 2 Site (check): Right Arm Serial # See below   Sterile Preparation:  [x]    Betadine    [_]   Chloraprep Expiration Date [_]  Insertion site was selected 8 - 10 cm from medial epicondyle and marked along with guiding site using sterile marker Procedure area was prepped and draped in a sterile fashion. 3 mL of 1% lidocaine without epinephrine used for subcutaneous anesthesia. Anesthesia confirmed. Nexplanon  trocar was inserted subcutaneously and then Nexplanon  capsule delivered subcutaneously Trocar was removed from the insertion site. Nexplanon  capsule was palpated by provider and patient to assure satisfactory placement. Estimated blood loss of 42mL Dressings applied: Gauze & Coban  Followup: The patient tolerated the procedure well without complications. Standard post-procedure care is explained and return precautions are given. Patient provided with Nexplanon Pocket card   Zettie Cooley, M.D.  Family Medicine  PGY-1 09/23/2018 12:07 PM

## 2018-09-23 NOTE — Progress Notes (Signed)
Maternal and infant discharge instructions are done with [employer] interpreter [phone]. Mother declines visit from Science writer. Infant breast feeding, breast milk supplement and formula are reviewed with parents who state understanding information.

## 2018-10-05 ENCOUNTER — Ambulatory Visit (INDEPENDENT_AMBULATORY_CARE_PROVIDER_SITE_OTHER): Payer: Medicaid Other

## 2018-10-05 ENCOUNTER — Other Ambulatory Visit: Payer: Self-pay

## 2018-10-05 DIAGNOSIS — Z5189 Encounter for other specified aftercare: Secondary | ICD-10-CM

## 2018-10-05 NOTE — Progress Notes (Signed)
Agree with A & P. 

## 2018-10-05 NOTE — Progress Notes (Signed)
  Pt here for Incision check,Incision looks good, advised any drainedge, odor, inflamation to go to MAU. Pt shared that she has been dizzy & nauseated, per Dr. Rip Harbour have pt to increase fluids & do the BRAT diet for a couple of days, if no change let us know.

## 2018-10-22 ENCOUNTER — Other Ambulatory Visit: Payer: Self-pay | Admitting: Internal Medicine

## 2018-10-22 DIAGNOSIS — Z20822 Contact with and (suspected) exposure to covid-19: Secondary | ICD-10-CM

## 2018-10-28 LAB — NOVEL CORONAVIRUS, NAA: SARS-CoV-2, NAA: DETECTED — AB

## 2018-11-03 ENCOUNTER — Telehealth (HOSPITAL_COMMUNITY): Payer: Self-pay | Admitting: Lactation Services

## 2018-11-03 NOTE — Telephone Encounter (Signed)
Outgoing phone call - with Spanish interpreter - [employer] this mom back and it was not for lactation it was for the clinic.  LC recommended via the interpreter for this patient to call back the clinic and  Push the number for the clinic not lactation.  This patients concern was for prolonged bleeding and she is 6 weeks out.  Also that she did not have a F/U appt.  The phone number provided. LC also sent a message via Epic to the cline with  Mom new phone number.

## 2018-11-19 ENCOUNTER — Telehealth: Payer: Self-pay | Admitting: Family Medicine

## 2018-11-19 NOTE — Telephone Encounter (Signed)
-----   Message from Lupita Dawn, RN sent at 11/03/2018 12:12 PM EDT ----- Regarding: post C/section Contact: [phone] This Post C/section patient called the [facility] phone line ( needs a Spanish interpreter ) and is post C/S 6 weeks and still having a  bleeding. She mentioned to the interpreter that she never had a F/U appt.  Delivered at [facility]. Please call her with a Spanish interpreter. ( mom had changed her phone number - [phone] ) . Thank you

## 2018-11-19 NOTE — Telephone Encounter (Signed)
Spoke with patient about going to health dept. Where she received her prenatal care. If she was bleeding she needed to call them for an appointment. Patient stated she would call them. Interpreter Patsy Lager #[phone].

## 2019-02-08 IMAGING — US US MFM OB FOLLOW-UP
1 series · 14 of 28 positions shown · non-contrast
Comparison: none

[Series 1: us mfm ob follow-up · 39 acquisitions, 14 frames shown]
[im 2/39]
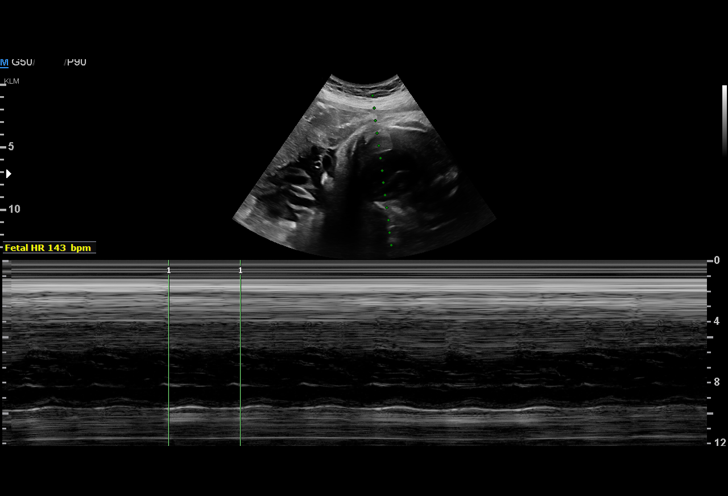
[im 5/39]
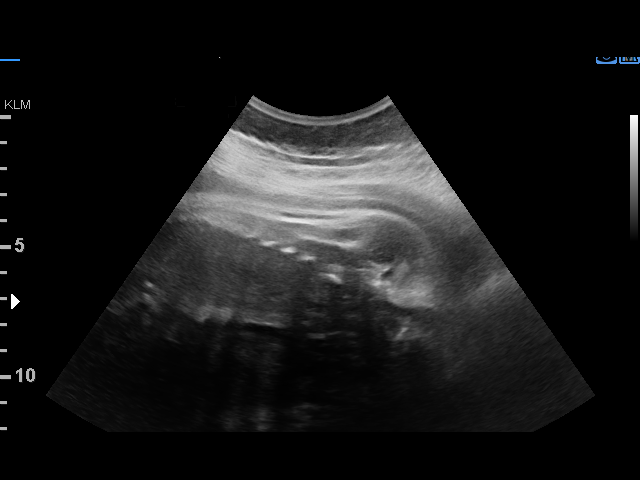
[im 8/39]
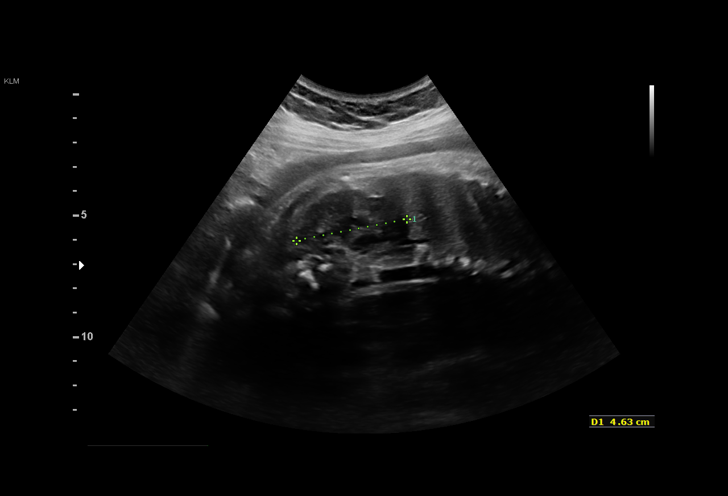
[im 10/39]
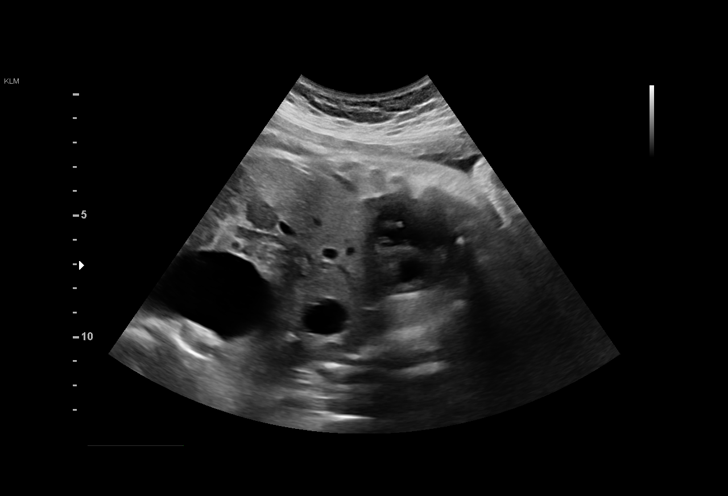
[im 13/39]
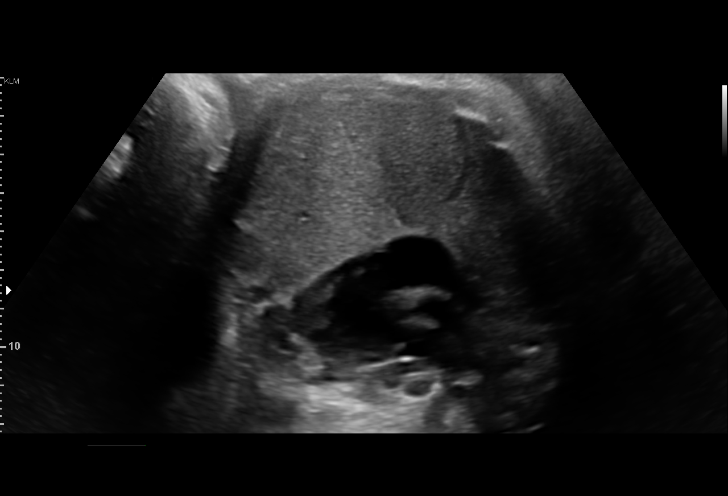
[im 16/39]
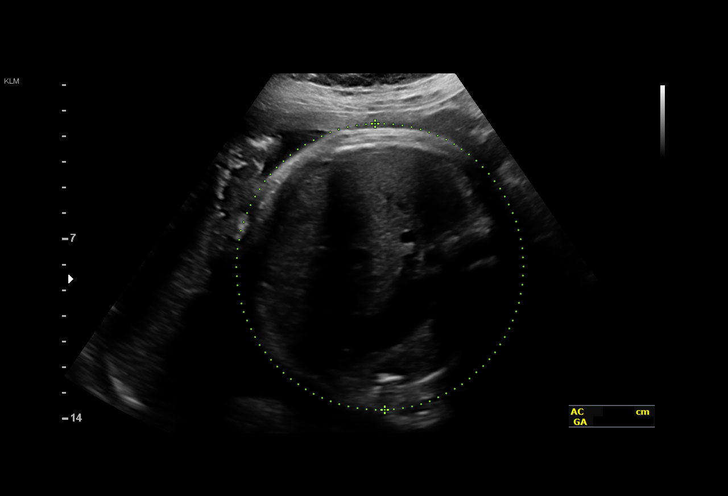
[im 19/39]
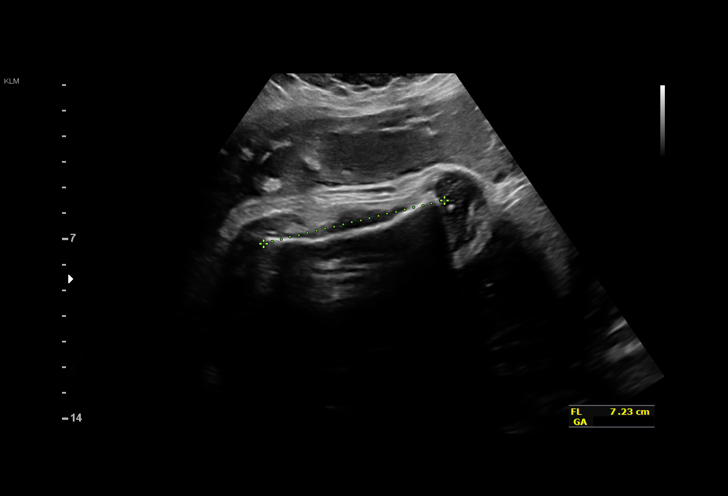
[im 22/39]
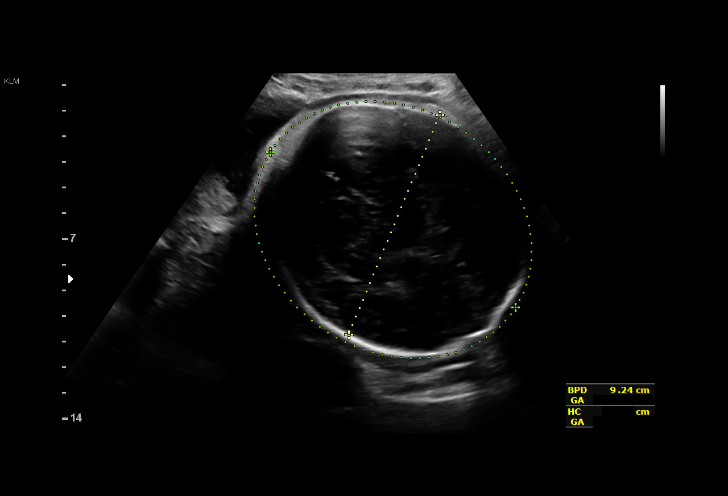
[im 24/39]
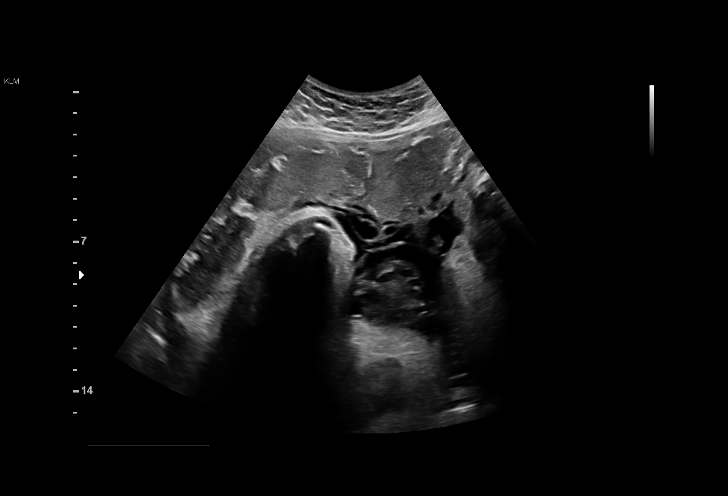
[im 27/39]
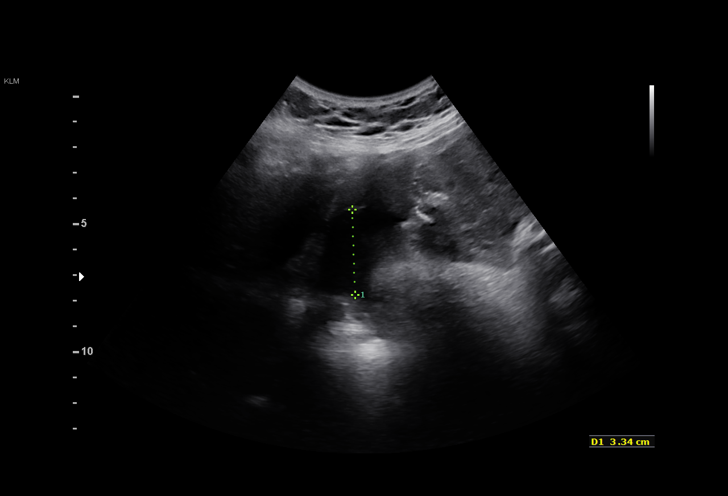
[im 30/39]
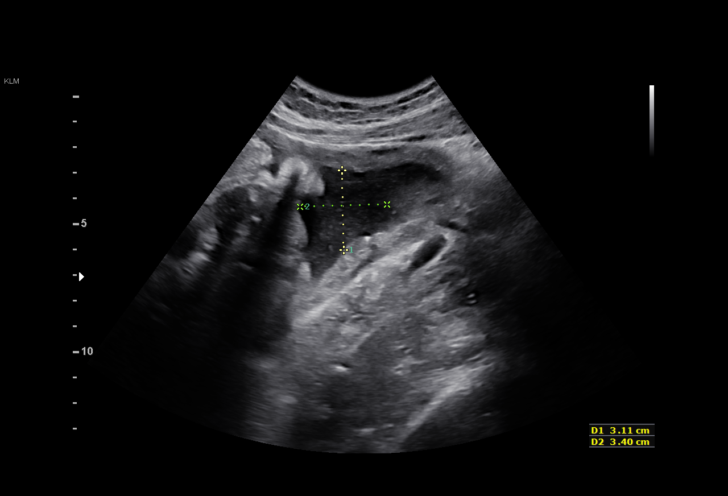
[im 33/39]
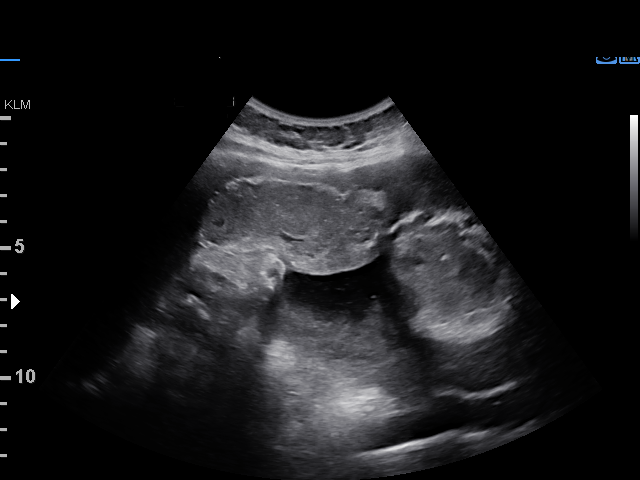
[im 36/39]
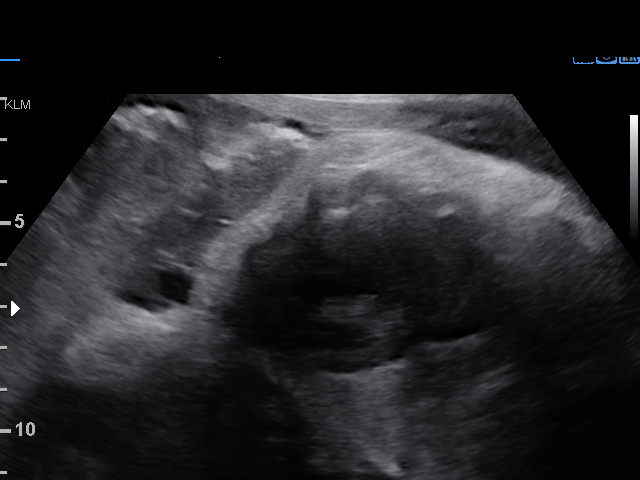
[im 39/39]
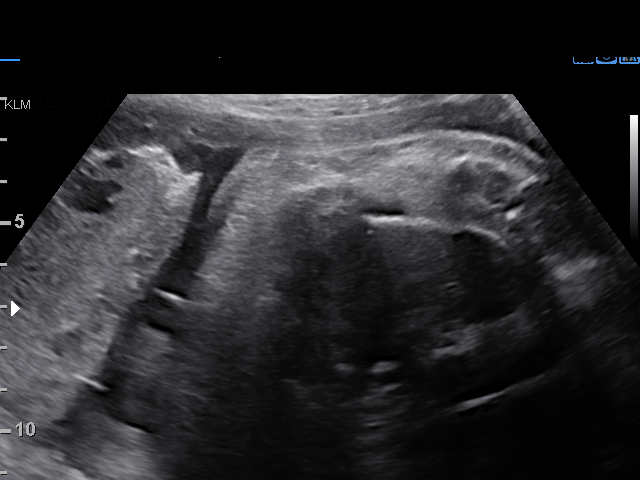

[14 of 28 positions shown; findings below may reference images not displayed]

1  PAULUS N CEEJAY           [id number]      [id number]     [id number]
Indications

38 weeks gestation of pregnancy
Teen pregnancy
Obesity complicating pregnancy, third
trimester
Gestational diabetes in pregnancy, diet
controlled; early diagnosis
OB History

Blood Type:            Height:  5'3"   Weight (lb):  174       BMI:
Gravidity:    1
Fetal Evaluation

Num Of Fetuses:     1
Fetal Heart         143
Rate(bpm):
Cardiac Activity:   Observed
Presentation:       Cephalic
Placenta:           Anterior, above cervical os
P. Cord Insertion:  Previously Visualized

Amniotic Fluid
AFI FV:      Subjectively within normal limits

AFI Sum(cm)     %Tile       Largest Pocket(cm)
12.09           42

RUQ(cm)       RLQ(cm)       LUQ(cm)        LLQ(cm)
3.34
Biometry

BPD:      92.9  mm     G. Age:  37w 5d         63  %    CI:        80.38   %    70 - 86
FL/HC:      22.3   %    20.9 -
HC:      327.3  mm     G. Age:  37w 1d         12  %    HC/AC:      0.94        0.92 -
AC:      346.9  mm     G. Age:  38w 4d         76  %    FL/BPD:     78.6   %    71 - 87
FL:         73  mm     G. Age:  37w 3d         31  %    FL/AC:      21.0   %    20 - 24

Est. FW:    2266  gm      7 lb 7 oz     76  %
Gestational Age

LMP:           38w 2d        Date:  05/20/16                 EDD:   02/24/17
U/S Today:     37w 5d                                        EDD:   02/28/17
Best:          38w 2d     Det. By:  LMP  (05/20/16)          EDD:   02/24/17
Anatomy

Cranium:               Appears normal         Aortic Arch:            Previously seen
Cavum:                 Previously seen        Ductal Arch:            Previously seen
Ventricles:            Previously seen        Diaphragm:              Previously seen
Choroid Plexus:        Previously             Stomach:                Appears normal, left
Heterogeneous                                  sided
Cerebellum:            Previously seen        Abdomen:                Appears normal
Posterior Fossa:       Previously seen        Abdominal Wall:         Previously seen
Nuchal Fold:           Previously seen        Cord Vessels:           Previously seen
Face:                  Orbits and profile     Kidneys:                Appear normal
previously seen
Lips:                  Previously seen        Bladder:                Appears normal
Thoracic:              Appears normal         Spine:                  Previously seen
Heart:                 Appears normal         Upper Extremities:      Previously seen
(4CH, axis, and
situs)
RVOT:                  Appears normal         Lower Extremities:      Previously seen
LVOT:                  Appears normal

Other:  Fetus appears to be a male. 5th digit previously visualized. Nasal
bone previously visualized. Technically difficult due to maternal
habitus and fetal position.
Cervix Uterus Adnexa

Cervix
Not visualized (advanced GA >11wks)
Impression

SIUP at 38+2 weeks
Cephalic presentation
Normal interval anatomy; anatomic survey complete
Normal amniotic fluid volume
Appropriate interval growth with EFW at the 76th %tile
Recommendations

Follow-up as clinically indicated

## 2020-03-06 IMAGING — US US OB COMP LESS 14 WK
1 series · 15 of 28 positions shown · non-contrast
Comparison: 01/25/2018

CLINICAL DATA: Followup viability. Uncertain last menstrual period.

EXAM:
OBSTETRIC <14 WK ULTRASOUND
TECHNIQUE: Transabdominal ultrasound was performed for evaluation of the
gestation as well as the maternal uterus and adnexal regions.

[Series 1: us ob comp less 14 wk · 15 of 36 slices shown]
[im 1/36]
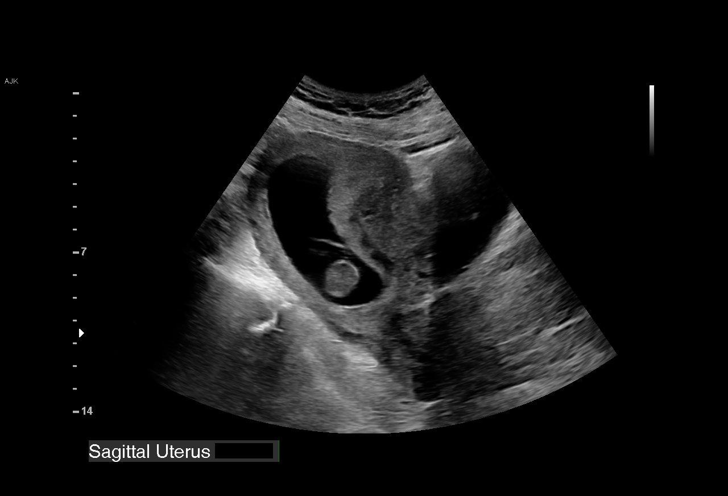
[im 3/36]
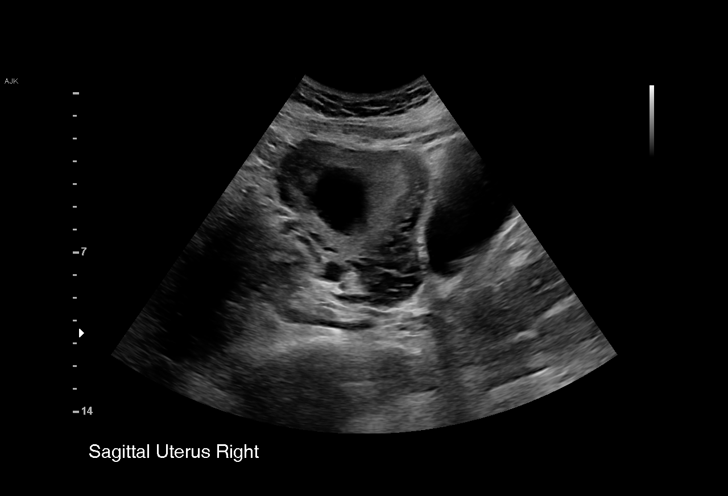
[im 6/36]
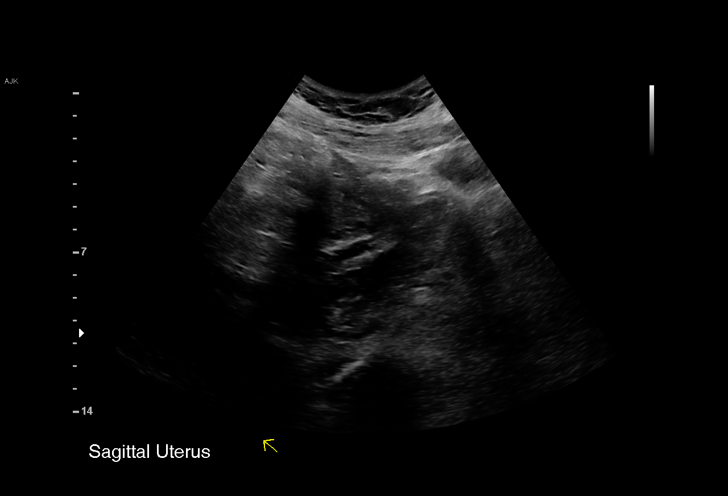
[im 8/36]
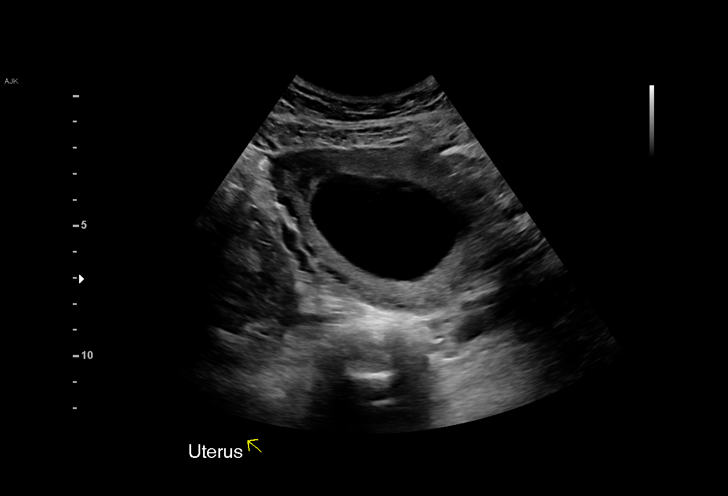
[im 11/36]
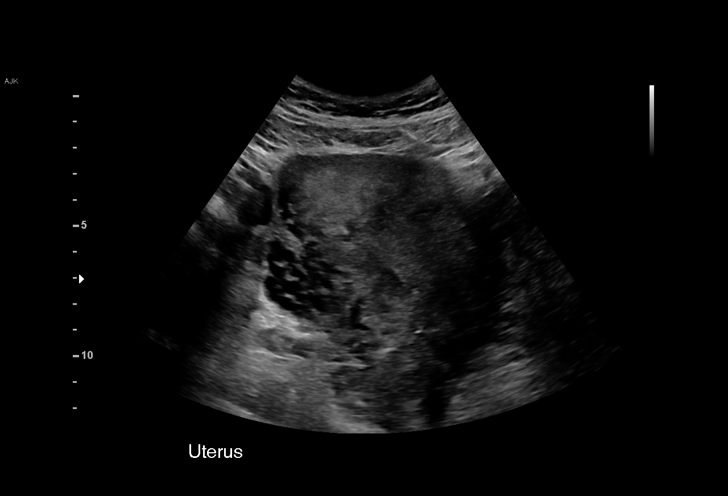
[im 13/36]
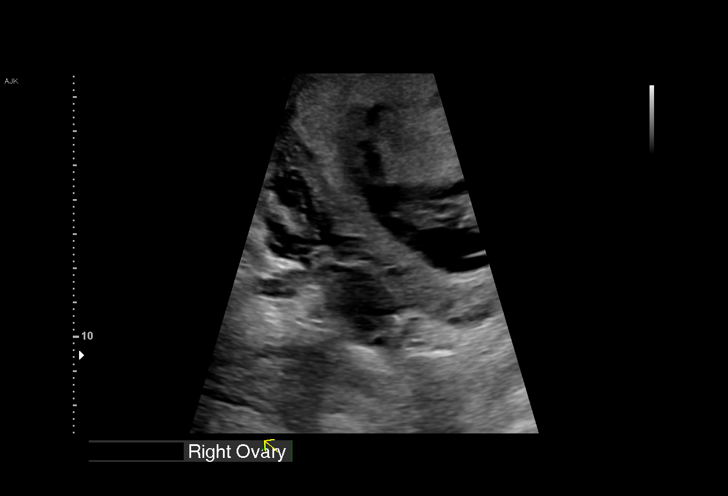
[im 16/36]
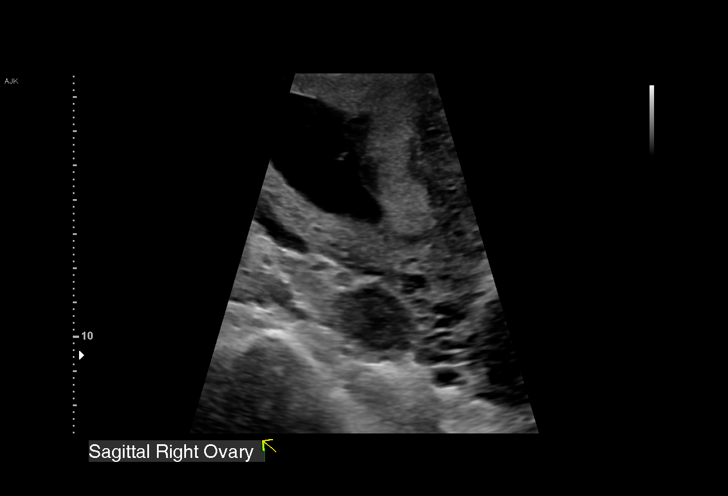
[im 19/36]
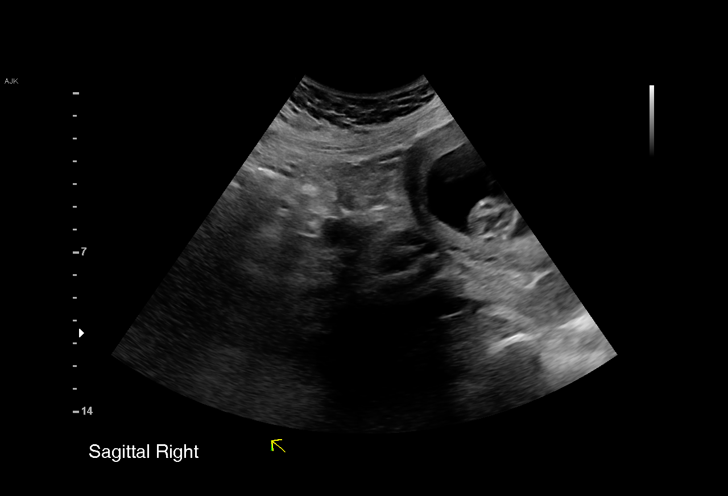
[im 20/36]
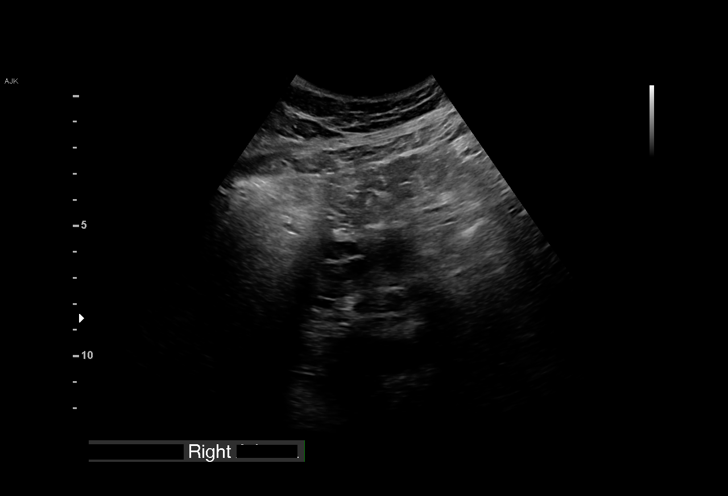
[im 23/36]
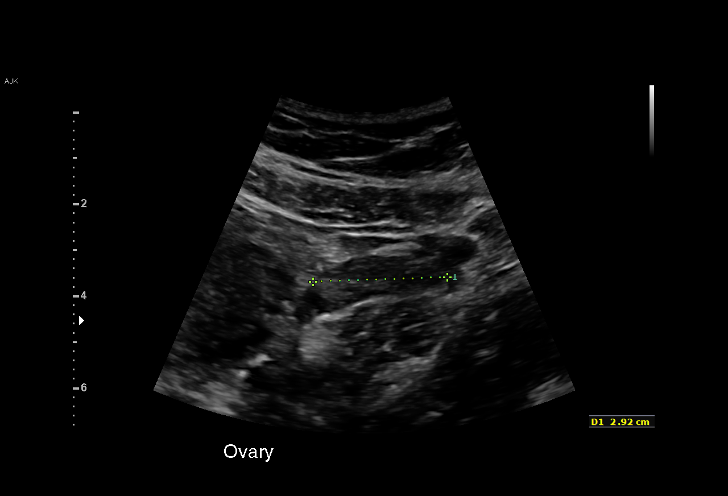
[im 25/36]
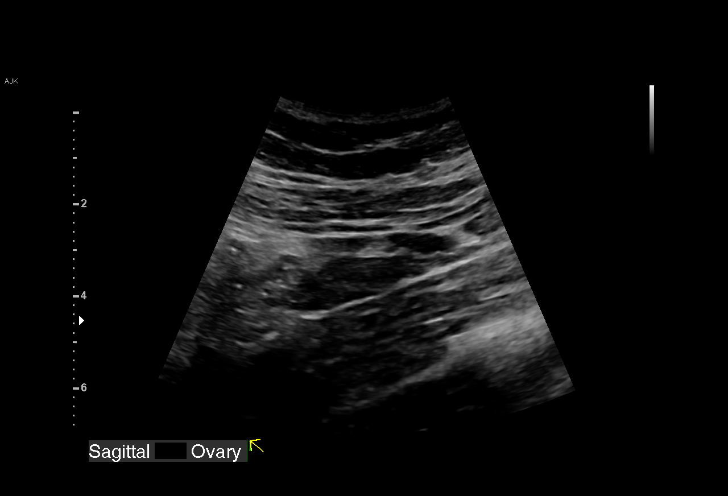
[im 28/36]
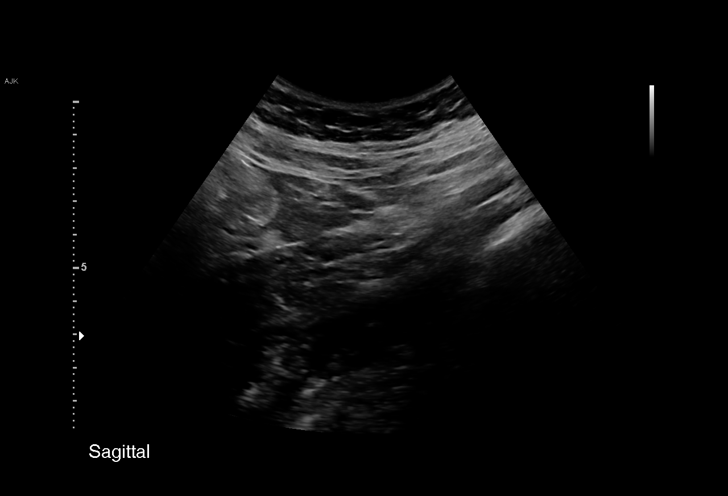
[im 30/36]
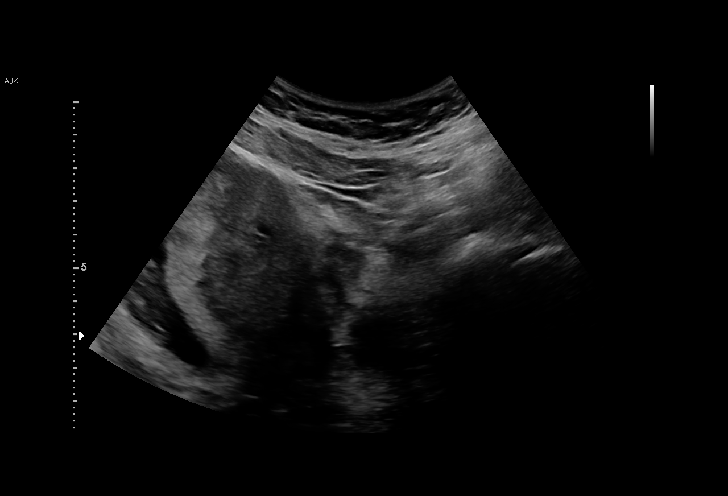
[im 33/36]
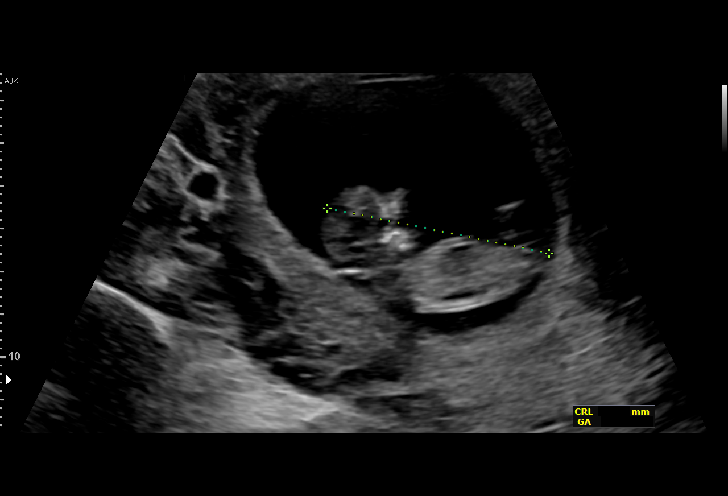
[im 36/36]
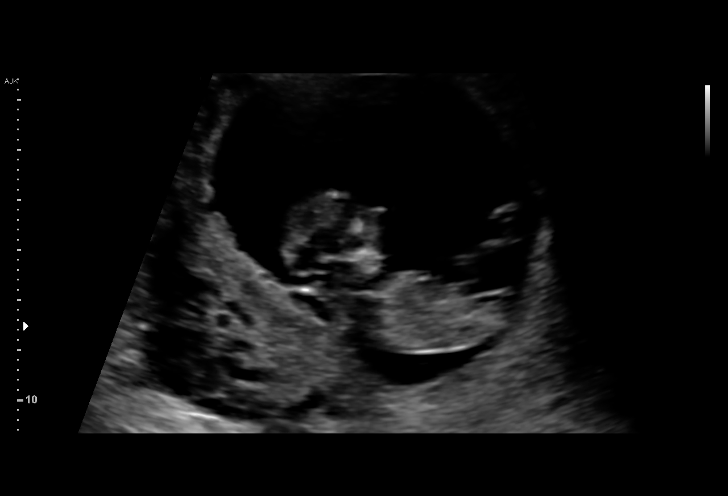

[15 of 28 positions shown; findings below may reference images not displayed]

FINDINGS: Intrauterine gestational sac: Single

Yolk sac:  Not Visualized.

Embryo:  Visualized.

Cardiac Activity: Visualized.

Heart Rate: 164 bpm

CRL:   50.5 mm   11 w 5 d                  US EDC: 09/25/2018

Maternal uterus/adnexae:

Subchorionic hemorrhage: None

Right ovary: Normal

Left ovary: Normal

Other :None

Free fluid:  None
IMPRESSION: 1. Single living intrauterine gestation with an estimated
gestational age of 11 weeks 5 days.

## 2023-10-21 ENCOUNTER — Encounter (HOSPITAL_COMMUNITY): Payer: Self-pay | Admitting: Emergency Medicine

## 2023-10-21 ENCOUNTER — Other Ambulatory Visit: Payer: Self-pay

## 2023-10-21 ENCOUNTER — Emergency Department (HOSPITAL_COMMUNITY)
Admission: EM | Admit: 2023-10-21 | Discharge: 2023-10-22 | Discharge status: Against Medical Advice | Payer: Self-pay | Attending: Physician Assistant | Admitting: Physician Assistant

## 2023-10-21 DIAGNOSIS — R140 Abdominal distension (gaseous): Secondary | ICD-10-CM | POA: Insufficient documentation

## 2023-10-21 DIAGNOSIS — R110 Nausea: Secondary | ICD-10-CM | POA: Insufficient documentation

## 2023-10-21 DIAGNOSIS — Z5321 Procedure and treatment not carried out due to patient leaving prior to being seen by health care provider: Secondary | ICD-10-CM | POA: Insufficient documentation

## 2023-10-21 DIAGNOSIS — R1013 Epigastric pain: Secondary | ICD-10-CM | POA: Insufficient documentation

## 2023-10-21 LAB — CBC WITH DIFFERENTIAL/PLATELET
Abs Immature Granulocytes: 0.04 K/uL (ref 0.00–0.07)
Basophils Absolute: 0.1 K/uL (ref 0.0–0.1)
Basophils Relative: 1 %
Eosinophils Absolute: 0.2 K/uL (ref 0.0–0.5)
Eosinophils Relative: 2 %
HCT: 40.6 % (ref 36.0–46.0)
Hemoglobin: 13.9 g/dL (ref 12.0–15.0)
Immature Granulocytes: 0 %
Lymphocytes Relative: 34 %
Lymphs Abs: 3.3 K/uL (ref 0.7–4.0)
MCH: 31.5 pg (ref 26.0–34.0)
MCHC: 34.2 g/dL (ref 30.0–36.0)
MCV: 92.1 fL (ref 80.0–100.0)
Monocytes Absolute: 0.5 K/uL (ref 0.1–1.0)
Monocytes Relative: 6 %
Neutro Abs: 5.6 K/uL (ref 1.7–7.7)
Neutrophils Relative %: 57 %
Platelets: 323 K/uL (ref 150–400)
RBC: 4.41 MIL/uL (ref 3.87–5.11)
RDW: 12.5 % (ref 11.5–15.5)
WBC: 9.7 K/uL (ref 4.0–10.5)
nRBC: 0 % (ref 0.0–0.2)

## 2023-10-21 LAB — URINALYSIS, ROUTINE W REFLEX MICROSCOPIC
Bilirubin Urine: NEGATIVE
Glucose, UA: NEGATIVE mg/dL
Hgb urine dipstick: NEGATIVE
Ketones, ur: NEGATIVE mg/dL
Leukocytes,Ua: NEGATIVE
Nitrite: NEGATIVE
Protein, ur: NEGATIVE mg/dL
Specific Gravity, Urine: 1.015 (ref 1.005–1.030)
pH: 5 (ref 5.0–8.0)

## 2023-10-21 LAB — COMPREHENSIVE METABOLIC PANEL WITH GFR
ALT: 14 U/L (ref 0–44)
AST: 20 U/L (ref 15–41)
Albumin: 3.5 g/dL (ref 3.5–5.0)
Alkaline Phosphatase: 54 U/L (ref 38–126)
Anion gap: 9 (ref 5–15)
BUN: 12 mg/dL (ref 6–20)
CO2: 22 mmol/L (ref 22–32)
Calcium: 9 mg/dL (ref 8.9–10.3)
Chloride: 106 mmol/L (ref 98–111)
Creatinine, Ser: 0.66 mg/dL (ref 0.44–1.00)
GFR, Estimated: >60 mL/min (ref >60)
Glucose, Bld: 135 mg/dL — ABNORMAL HIGH (ref 70–99)
Potassium: 3.5 mmol/L (ref 3.5–5.1)
Sodium: 137 mmol/L (ref 135–145)
Total Bilirubin: 0.4 mg/dL (ref 0.0–1.2)
Total Protein: 7.1 g/dL (ref 6.5–8.1)

## 2023-10-21 LAB — LIPASE, BLOOD: Lipase: 49 U/L (ref 11–51)

## 2023-10-21 LAB — HCG, SERUM, QUALITATIVE: Preg, Serum: NEGATIVE

## 2023-10-21 NOTE — ED Provider Triage Note (Signed)
 Emergency Medicine Provider Triage Evaluation Note  Chrissi Crow , a 28 y.o. female  was evaluated in triage.  Pt complains of epigastric pain has been ongoing for the past 2 weeks, exacerbated with any oral intake.  Last menstrual cycle approximately a month ago, however is 3 days late and some concern for pregnancy.  Has not taken anything for improvement in her symptoms.  Normal bowel movements, no fevers, no diarrhea.  Prior history of cholecystectomy  Review of Systems  Positive: Abdominal pain, nausea Negative: Urinary symptoms, fever  Physical Exam  There were no vitals taken for this visit. Gen:   Awake, no distress   Resp:  Normal effort  MSK:   Moves extremities without difficulty  Other:  Neurolyse abdominal pain without any focal point of tenderness  Medical Decision Making  Medically screening exam initiated at 9:23 PM.  Appropriate orders placed.  Emerie Vanderkolk was informed that the remainder of the evaluation will be completed by another provider, this initial triage assessment does not replace that evaluation, and the importance of remaining in the ED until their evaluation is complete.     Chirag Krueger, PA-C 10/21/23 [id number]

## 2023-10-21 NOTE — ED Triage Notes (Signed)
 Patient complainign of epigastric pain for 2 weeks that worsens after eating. Repotrs abdominal distension, nausea, and period is late 3 days. No meds PTA.

## 2023-10-22 NOTE — ED Notes (Signed)
 Pt said she was leaving and was seen leaving the ED
